# Patient Record
Sex: Male | Born: 1970 | Race: White | Hispanic: No | Marital: Single | Smoking: Current every day smoker
Health system: Southern US, Community
[De-identification: ages and names within clinical notes are randomized; demographics above are authoritative.]

## PROBLEM LIST (undated history)

## (undated) DIAGNOSIS — I739 Peripheral vascular disease, unspecified: Secondary | ICD-10-CM

## (undated) DIAGNOSIS — I82419 Acute embolism and thrombosis of unspecified femoral vein: Secondary | ICD-10-CM

## (undated) DIAGNOSIS — M1990 Unspecified osteoarthritis, unspecified site: Secondary | ICD-10-CM

## (undated) HISTORY — DX: Peripheral vascular disease, unspecified: I73.9

## (undated) HISTORY — PX: APPENDECTOMY: SHX54

---

## 1997-06-14 HISTORY — PX: WISDOM TOOTH EXTRACTION: SHX21

## 1998-06-14 HISTORY — PX: CYST EXCISION: SHX5701

## 2014-12-23 ENCOUNTER — Ambulatory Visit
Admission: EM | Admit: 2014-12-23 | Discharge: 2014-12-23 | Disposition: A | Discharge status: Home / Self Care | Payer: Managed Care, Other (non HMO) | Attending: Internal Medicine | Admitting: Internal Medicine

## 2014-12-23 ENCOUNTER — Encounter: Payer: Self-pay | Admitting: Emergency Medicine

## 2014-12-23 ENCOUNTER — Ambulatory Visit: Payer: Managed Care, Other (non HMO)

## 2014-12-23 DIAGNOSIS — M79671 Pain in right foot: Secondary | ICD-10-CM | POA: Insufficient documentation

## 2014-12-23 DIAGNOSIS — S92001A Unspecified fracture of right calcaneus, initial encounter for closed fracture: Secondary | ICD-10-CM | POA: Insufficient documentation

## 2014-12-23 DIAGNOSIS — W11XXXA Fall on and from ladder, initial encounter: Secondary | ICD-10-CM | POA: Insufficient documentation

## 2014-12-23 DIAGNOSIS — M25571 Pain in right ankle and joints of right foot: Secondary | ICD-10-CM | POA: Insufficient documentation

## 2014-12-23 MED ORDER — KETOROLAC TROMETHAMINE 60 MG/2ML IM SOLN
60.0000 mg | Freq: Once | INTRAMUSCULAR | Status: AC
  Administered 2014-12-23: 60 mg via INTRAMUSCULAR

## 2014-12-23 MED ORDER — HYDROCODONE-ACETAMINOPHEN 5-325 MG PO TABS: 1.0000 | ORAL_TABLET | Freq: Four times a day (QID) | ORAL | Status: DC | PRN

## 2014-12-23 NOTE — ED Notes (Signed)
Fell off ladder about 8 feet tall directly onto left heel

## 2014-12-23 NOTE — Discharge Instructions (Signed)
You are to be 100 % NON-WEIGHT -BEARING until seen by Orthopedics and evaluated.. Use crutches please ! ICE and ELEVATION will be very important !! Call tomorrow and schedule a consultation with Dr Joice Lofts or his PA on Wednesday at the  [facility]  Calcaneal Fracture Repair There are many different ways of treating fractures of the large irregular bone in the foot that makes up the heel of the foot (calcaneus). Calcaneal fractures can be treated with:   Immobilization--The fracture is casted as it is without changing the positions of the fracture involved.   Closed reduction--The bones are manipulated back into position without opening the site of the fracture using surgery.   Open reduction and internal fixation--The fracture site is opened and the bone pieces are fixed into place with some type of hardware (such as a screw).   Primary arthrodesis--The joint has enough damage that a procedure is done as the first treatment which will leave the joint permanently stiff. This will decrease function, however usually will leave the joint pain free. LET [facility] CARE PROVIDER KNOW ABOUT:  Any allergies you have.   All medicines you are taking, including vitamins, herbs, eye drops, creams, and over-the-counter medicines.   Previous problems you or members of your family have had with the use of anesthetics.  Any blood disorders you have.   Previous surgeries you have had.   Medical conditions you have.  RISKS AND COMPLICATIONS Generally, calcaneal fracture repair is a safe procedure. However, as with any procedure, complications can occur. Possible complications include:   Swelling of the foot and ankle.  Infection of the wound or bone.  Arthritis.  Chronic pain of the foot.  Nerve injury.  Blood clot in the legs or lungs. BEFORE THE PROCEDURE  Ask your health care provider about changing or stopping your regular medicines. You may need to stop taking  certain medicines, such as aspirin or blood thinners, at least 1 week before the surgery.  X-rays and any imaging studies are reviewed with your healthcare provider. The surgeon will advise you on the best surgical approach to repair your fracture.  Do not eat or drink anything for at least 8 hours before the surgery or as directed by your health care provider.   If you smoke, do not smoke for at least 2 weeks before the surgery.   Make plans to have someone drive you home after the procedure. Also arrange for someone to help you with activities during recovery.  PROCEDURE   You will be given medicine to help you relax (sedative). You will then be given medicine to make you sleep through the procedure (general anesthetic). These medicines will be given through an IV access tube that is put into one of your veins.   A nerve block or numbing medicine (local anesthetic) may also be used to keep you comfortable.  Once you are asleep, the foot will be cleaned and shaved if needed.  The surgeon may use a percutaneous or open technique for this surgery:  In the percutaneous approach, small cuts and pins are used to repair the fracture.  In the open technique, a cut is made along the outside of the foot and the bone pieces are placed back together with hardware. A drain may be left to collect fluid. It is removed 3-4 days after the procedure.  The surgeon then uses staples or stitches to close the incision or cuts. AFTER THE PROCEDURE  After surgery you will be  taken to the recovery area where a nurse will watch and check your progress for 1-3 hours. Once you are awake, stable, and taking fluids well, and if you do not have any other problems, you will be allowed to go home.  You will be given pain medicine if needed.  The IV access tube will be removed before you are discharged. Document Released: 03/10/2005 Document Revised: 03/21/2013 Document Reviewed: 01/02/2013 [facility] Patient  Information 2015 [place], [place]. This information is not intended to replace advice given to you by your health care provider. Make sure you discuss any questions you have with your health care provider.

## 2014-12-23 NOTE — ED Provider Notes (Signed)
CSN: [id number]     Arrival date & time 12/23/14  1728 History   First MD Initiated Contact with Patient 12/23/14 1854     Chief Complaint  Patient presents with  . Foot Injury   (Consider location/radiation/quality/duration/timing/severity/associated sxs/prior Treatment) HPI 44 yo M on approx 8 foot step ladder- load shifted and he lost his balance. Threw tools and fell the other way landing right heel first on a cement slab taking full impact through tennis shoes. Felt crushing pain. Relaxed legs and rolled into the grass. Denies striking any other part of his body-specifically denies any head contact or loss of awareness.   Smokes 1 PPD; drinks 12 pack beer on weekends, denies alcohol during week. Denies any pain anywhere in his body except right foot. Has been home before driving himself here.Takes Naproxen routinely for general aches-denies other medications  History reviewed. No pertinent past medical history. Past Surgical History  Procedure Laterality Date  . Cyst excision  2000  pilonidal cyst  History reviewed. No pertinent family history. History  Substance Use Topics  . Smoking status: Current Every Day Smoker  . Smokeless tobacco: Never Used  . Alcohol Use: Yes    Review of Systems Constitutional -afebrile Eyes-denies visual changes ENT- normal voice,denies sore throat CV-denies chest pain Resp-denies SOB GI- negative for nausea,vomiting, diarrhea GU- negative for dysuria MSK- negative for back pain, extremity pain except right foot and ankle Skin- denies acute changes Neuro- negative headache,focal weakness or numbness    Allergies  Review of patient's allergies indicates no known allergies.  Home Medications   Prior to Admission medications   Medication Sig Start Date End Date Taking? Authorizing Provider  naproxen sodium (ANAPROX) 220 MG tablet Take 220 mg by mouth 2 (two) times daily with a meal.   Yes Historical Provider, MD  HYDROcodone-acetaminophen  (NORCO/VICODIN) 5-325 MG per tablet Take 1-2 tablets by mouth every 6 (six) hours as needed. 12/23/14   Rae Halsted, PA-C   BP 120/75 mmHg  Pulse 90  Temp(Src) 98.2 F (36.8 C) (Tympanic)  Resp 16  Ht  (1.753 m)  Wt 168 lb (76.204 kg)  BMI 24.80 kg/m2  SpO2 97% Physical Exam   Constitutional -alert and oriented, discomfort specific to right foot and ankle-denies trauma to any other part of body Head-atraumatic, normocephalic Eyes- conjunctiva normal, EOMI ,conjugate gaze Ears- pierced, canals clear , TMs neg Nose- no congestion or rhinorrhea Mouth/throat- mucous membranes moist , Neck- supple without glandular enlargement CV- regular rate, grossly normal heart sounds,  Resp-no distress, normal respiratory effort,clear to auscultation bilaterally GI- soft,non-tender,no distention GU-  not examined MSK- right ankle is swollen, medial and lateral; dorsum of right foot especially laterally is swollen and distorted, lateral hind foot and heel are extremely tender to any touch-only mildly ecchymotic at this time. Sensation mildly altered on dorsum by patient report. Toes with good cap fill,warm. Too swollen to feel pulses. Can move toes . Tennis shoe removed for exam and xray very painful-pillow support. Neuro- normal speech and language, no gross focal neurological deficit appreciated,  Skin-warm,dry ,intact; no rash noted-ecchymosis developing right lateral ankle/heel; extensive tattooing  Psych-mood and affect grossly normal; speech and behavior grossly normal ED Course  Procedures (including critical care time) Labs Review Labs Reviewed - No data to display  Imaging Review Dg Ankle Complete Right  12/23/2014   CLINICAL DATA:  Ankle and heel pain after fall from ladder today. Initial encounter.  EXAM: RIGHT ANKLE - COMPLETE 3+ VIEW  COMPARISON:  None.  FINDINGS: There is an acute mildly depressed intra-articular compression fracture of the calcaneus. The talar dome, distal tibia  and distal fibula appear intact. There is mild hindfoot and lateral ankle soft tissue swelling.  IMPRESSION: No acute osseous findings demonstrated at the ankle. Mildly depressed intra-articular compression fracture of the calcaneus.   Electronically Signed   By: Carey BullocksWilliam  Veazey M.D.   On: 12/23/2014 18:40   Dg Os Calcis Right  12/23/2014   CLINICAL DATA:  Ankle and heel pain after fall from ladder today. Initial encounter.  EXAM: RIGHT OS CALCIS - 2+ VIEW  COMPARISON:  None.  FINDINGS: There is an acute mildly depressed and displaced intra-articular compression fracture of the calcaneus. This has a centrolateral depression type configuration with extension to the subtalar joint. The talus appears intact.  IMPRESSION: Mildly depressed and displaced intra-articular compression fracture of the calcaneus.   Electronically Signed   By: Carey BullocksWilliam  Veazey M.D.   On: 12/23/2014 18:44   Dg Foot Complete Right  12/23/2014   CLINICAL DATA:  Ankle and heel pain after fall from ladder today. Initial encounter.  EXAM: RIGHT FOOT COMPLETE - 3+ VIEW  COMPARISON:  None.  FINDINGS: There is an acute mildly depressed intra-articular compression fracture of the calcaneus. The talus appears intact. The additional tarsal bones appear intact. No forefoot abnormalities identified. Hindfoot soft tissue swelling noted.  IMPRESSION: Acute mildly depressed intra-articular compression fracture of the calcaneus.   Electronically Signed   By: Carey BullocksWilliam  Veazey M.D.   On: 12/23/2014 18:41   Medications  ketorolac (TORADOL) injection 60 mg (60 mg Intramuscular Given 12/23/14 1923)  Felt significant improvement in pain with Rx  Consulted Dr. Joice LoftsPoggi Gaylord Shih[facility] and he recommended boot orthosis, crutches, non-weight bearing-ice/elevation. [facility] schedule a visit with Dr Joice LoftsPoggi or his PA on Wednesday. This information was shared with the patient. He states he understands and will follow  through  Fitted with Large boot orthosis and crutches. MDM   1. Calcaneus fracture, right, closed, initial encounter    Plan: 1. Test/x-ray films and results with  diagnosis reviewed with patient- Ortho contact was established and consultation as noted above. He will follow up with appointment scheduling tomorrow for Wednesday. 2. He initially deferred pain Rx but was encouraged to have a small amount of Vicodin available  and accepted. Rx  as per orders; risks, benefits, potential side effects reviewed with patient. May use ibuprofen 800 mg every 6-8 hours as needed to support for pain relief. 3. Recommend supportive treatment with boot orthosis, crutches, non-weight bearing, ice, elevation- continual - stressed. 4. F/u if toe checks fail to show good cap fill, sensation decreases in foot or toes, or pain increases /not controlled by Rx supplied. [facility] if increased assistance required before Wed appointment. 5. Questions fielded, expectations and recommendations reviewed. Patient expresses understanding.  Rae HalstedLaurie W Aara Jacquot, PA-C 12/25/14 1712

## 2014-12-24 ENCOUNTER — Other Ambulatory Visit: Payer: Self-pay | Admitting: Podiatry

## 2014-12-24 DIAGNOSIS — S92001A Unspecified fracture of right calcaneus, initial encounter for closed fracture: Secondary | ICD-10-CM

## 2014-12-27 ENCOUNTER — Encounter: Payer: Self-pay | Admitting: *Deleted

## 2014-12-30 ENCOUNTER — Ambulatory Visit
Admission: RE | Admit: 2014-12-30 | Discharge: 2014-12-30 | Disposition: A | Discharge status: Home / Self Care | Payer: Managed Care, Other (non HMO) | Source: Ambulatory Visit | Attending: Podiatry | Admitting: Podiatry

## 2014-12-30 DIAGNOSIS — X58XXXA Exposure to other specified factors, initial encounter: Secondary | ICD-10-CM | POA: Diagnosis not present

## 2014-12-30 DIAGNOSIS — S92001A Unspecified fracture of right calcaneus, initial encounter for closed fracture: Secondary | ICD-10-CM

## 2014-12-30 NOTE — Discharge Instructions (Signed)
[facility] °[facility] ° °POST OPERATIVE INSTRUCTIONS FOR DR. TROXLER AND DR. FOWLER °[facility] ° ° °1. Take your medication as prescribed.  Pain medication should be taken only as needed. ° °2. Keep the dressing clean, dry and intact. ° °3. Keep your foot elevated above the heart level for the first 48 hours. ° °4. Walking to the bathroom and brief periods of walking are acceptable, unless we have instructed you to be non-weight bearing. ° °5. Always wear your post-op shoe when walking.  Always use your crutches if you are to be non-weight bearing. ° °6. Do not take a shower. Baths are permissible as long as the foot is kept out of the water.  ° °7. Every hour you are awake:  °- Bend your knee 15 times. °- Flex foot 15 times °- Massage calf 15 times ° °8. Call [facility] ([phone]) if any of the following problems occur: °- You develop a temperature or fever. °- The bandage becomes saturated with blood. °- Medication does not stop your pain. °- Injury of the foot occurs. °- Any symptoms of infection including redness, odor, or red streaks running from wound. °-  ° °General Anesthesia, Care After °Refer to this sheet in the next few weeks. These instructions provide you with information on caring for yourself after your procedure. Your health care provider may also give you more specific instructions. Your treatment has been planned according to current medical practices, but problems sometimes occur. Call your health care provider if you have any problems or questions after your procedure. °WHAT TO EXPECT AFTER THE PROCEDURE °After the procedure, it is typical to experience: °· Sleepiness. °· Nausea and vomiting. °HOME CARE INSTRUCTIONS °· For the first 24 hours after general anesthesia: °¨ Have a responsible person with you. °¨ Do not drive a car. If you are alone, do not take public transportation. °¨ Do not drink alcohol. °¨ Do not take medicine  that has not been prescribed by your health care provider. °¨ Do not sign important papers or make important decisions. °¨ You may resume a normal diet and activities as directed by your health care provider. °· Change bandages (dressings) as directed. °· If you have questions or problems that seem related to general anesthesia, call the hospital and ask for the anesthetist or anesthesiologist on call. °SEEK MEDICAL CARE IF: °· You have nausea and vomiting that continue the day after anesthesia. °· You develop a rash. °SEEK IMMEDIATE MEDICAL CARE IF:  °· You have difficulty breathing. °· You have chest pain. °· You have any allergic problems. °Document Released: 09/06/2000 Document Revised: 06/05/2013 Document Reviewed: 12/14/2012 °ExitCare® Patient Information ©2015 Exit[employer], LLC. This information is not intended to replace advice given to you by your health care provider. Make sure you discuss any questions you have with your health care provider. ° °

## 2015-01-01 ENCOUNTER — Ambulatory Visit
Admission: RE | Admit: 2015-01-01 | Discharge: 2015-01-01 | Disposition: A | Discharge status: Home / Self Care | Payer: Managed Care, Other (non HMO) | Source: Ambulatory Visit | Attending: Podiatry | Admitting: Podiatry

## 2015-01-01 ENCOUNTER — Encounter
Admission: RE | Disposition: A | Discharge status: Home / Self Care | Payer: Self-pay | Source: Ambulatory Visit | Attending: Podiatry

## 2015-01-01 ENCOUNTER — Ambulatory Visit: Payer: Managed Care, Other (non HMO) | Admitting: Anesthesiology

## 2015-01-01 DIAGNOSIS — F17200 Nicotine dependence, unspecified, uncomplicated: Secondary | ICD-10-CM | POA: Insufficient documentation

## 2015-01-01 DIAGNOSIS — H409 Unspecified glaucoma: Secondary | ICD-10-CM | POA: Diagnosis not present

## 2015-01-01 DIAGNOSIS — Z79899 Other long term (current) drug therapy: Secondary | ICD-10-CM | POA: Insufficient documentation

## 2015-01-01 DIAGNOSIS — W11XXXA Fall on and from ladder, initial encounter: Secondary | ICD-10-CM | POA: Insufficient documentation

## 2015-01-01 DIAGNOSIS — S92061A Displaced intraarticular fracture of right calcaneus, initial encounter for closed fracture: Secondary | ICD-10-CM | POA: Insufficient documentation

## 2015-01-01 DIAGNOSIS — S92001A Unspecified fracture of right calcaneus, initial encounter for closed fracture: Secondary | ICD-10-CM | POA: Diagnosis present

## 2015-01-01 HISTORY — PX: ORIF ANKLE FRACTURE: SHX5408

## 2015-01-01 SURGERY — OPEN REDUCTION INTERNAL FIXATION (ORIF) ANKLE FRACTURE
Anesthesia: Regional | Laterality: Right | Wound class: Clean

## 2015-01-01 MED ORDER — GLYCOPYRROLATE 0.2 MG/ML IJ SOLN
INTRAMUSCULAR | Status: DC | PRN
  Administered 2015-01-01: 0.1 mg via INTRAVENOUS

## 2015-01-01 MED ORDER — OXYCODONE-ACETAMINOPHEN 7.5-325 MG PO TABS: 1.0000 | ORAL_TABLET | ORAL | Status: DC | PRN

## 2015-01-01 MED ORDER — LACTATED RINGERS IV SOLN
INTRAVENOUS | Status: DC
  Administered 2015-01-01 (×2): via INTRAVENOUS

## 2015-01-01 MED ORDER — LACTATED RINGERS IV SOLN: 500.0000 mL | INTRAVENOUS | Status: DC

## 2015-01-01 MED ORDER — LIDOCAINE HCL (CARDIAC) 20 MG/ML IV SOLN
INTRAVENOUS | Status: DC | PRN
  Administered 2015-01-01: 50 mg via INTRATRACHEAL

## 2015-01-01 MED ORDER — MIDAZOLAM HCL 2 MG/2ML IJ SOLN
INTRAMUSCULAR | Status: DC | PRN
  Administered 2015-01-01: 4 mg via INTRAVENOUS
  Administered 2015-01-01: 2 mg via INTRAVENOUS

## 2015-01-01 MED ORDER — OXYCODONE HCL 5 MG PO TABS: 5.0000 mg | ORAL_TABLET | Freq: Once | ORAL | Status: DC | PRN

## 2015-01-01 MED ORDER — ACETAMINOPHEN 325 MG PO TABS: 325.0000 mg | ORAL_TABLET | ORAL | Status: DC | PRN

## 2015-01-01 MED ORDER — ONDANSETRON HCL 4 MG/2ML IJ SOLN
4.0000 mg | Freq: Once | INTRAMUSCULAR | Status: AC | PRN
  Administered 2015-01-01: 4 mg via INTRAVENOUS

## 2015-01-01 MED ORDER — ACETAMINOPHEN 160 MG/5ML PO SOLN: 325.0000 mg | ORAL | Status: DC | PRN

## 2015-01-01 MED ORDER — ROPIVACAINE HCL 5 MG/ML IJ SOLN
INTRAMUSCULAR | Status: DC | PRN
  Administered 2015-01-01: 40 mL via PERINEURAL

## 2015-01-01 MED ORDER — FENTANYL CITRATE (PF) 100 MCG/2ML IJ SOLN
INTRAMUSCULAR | Status: DC | PRN
  Administered 2015-01-01: 100 ug via INTRAVENOUS
  Administered 2015-01-01 (×2): 25 ug via INTRAVENOUS

## 2015-01-01 MED ORDER — 0.9 % SODIUM CHLORIDE (POUR BTL) OPTIME
TOPICAL | Status: DC | PRN
  Administered 2015-01-01: 700 mL

## 2015-01-01 MED ORDER — BUPIVACAINE HCL (PF) 0.25 % IJ SOLN
INTRAMUSCULAR | Status: DC | PRN
  Administered 2015-01-01: 10 mL

## 2015-01-01 MED ORDER — OXYCODONE HCL 5 MG/5ML PO SOLN: 5.0000 mg | Freq: Once | ORAL | Status: DC | PRN

## 2015-01-01 MED ORDER — DEXAMETHASONE SODIUM PHOSPHATE 4 MG/ML IJ SOLN
INTRAMUSCULAR | Status: DC | PRN
  Administered 2015-01-01: 4 mg via INTRAVENOUS
  Administered 2015-01-01: 4 mg via PERINEURAL

## 2015-01-01 MED ORDER — PROPOFOL 10 MG/ML IV BOLUS
INTRAVENOUS | Status: DC | PRN
  Administered 2015-01-01: 200 mg via INTRAVENOUS

## 2015-01-01 MED ORDER — CEFAZOLIN SODIUM-DEXTROSE 2-3 GM-% IV SOLR
2.0000 g | Freq: Once | INTRAVENOUS | Status: AC
  Administered 2015-01-01: 2 g via INTRAVENOUS

## 2015-01-01 SURGICAL SUPPLY — 58 items
2.0 MM K WIRE ×2 IMPLANT
2.5 MM DRILL BIT ( BIOMET) ×2 IMPLANT
2.7MM DRILL BIT ×2 IMPLANT
5.0MM SCHANZ PIN ×2 IMPLANT
BANDAGE ELASTIC 4 CLIP NS LF (GAUZE/BANDAGES/DRESSINGS) ×4 IMPLANT
BLADE SURG 15 STRL LF DISP TIS (BLADE) ×1 IMPLANT
BLADE SURG 15 STRL SS (BLADE) ×1
BLADE SURG MINI STRL (BLADE) IMPLANT
BNDG ESMARK 4X12 TAN STRL LF (GAUZE/BANDAGES/DRESSINGS) ×2 IMPLANT
BNDG GAUZE 4.5X4.1 6PLY STRL (MISCELLANEOUS) ×2 IMPLANT
BNDG STRETCH 4X75 STRL LF (GAUZE/BANDAGES/DRESSINGS) ×2 IMPLANT
CANISTER SUCT 1200ML W/VALVE (MISCELLANEOUS) ×2 IMPLANT
COVER PIN YLW 0.028-062 (MISCELLANEOUS) ×10 IMPLANT
DRAPE C-ARM XRAY 36X54 (DRAPES) ×2 IMPLANT
DRAPE FLUOR MINI C-ARM 54X84 (DRAPES) IMPLANT
DURAPREP 26ML APPLICATOR (WOUND CARE) ×2 IMPLANT
GAUZE PETRO XEROFOAM 1X8 (MISCELLANEOUS) ×2 IMPLANT
GAUZE SPONGE 4X4 12PLY STRL (GAUZE/BANDAGES/DRESSINGS) ×2 IMPLANT
GLOVE BIO SURGEON STRL SZ7.5 (GLOVE) ×2 IMPLANT
GLOVE INDICATOR 8.0 STRL GRN (GLOVE) ×2 IMPLANT
GOWN STRL REUS W/ TWL LRG LVL3 (GOWN DISPOSABLE) ×2 IMPLANT
GOWN STRL REUS W/TWL LRG LVL3 (GOWN DISPOSABLE) ×2
K-WIRE DBL END TROCAR 6X.045 (WIRE)
K-WIRE DBL END TROCAR 6X.062 (WIRE) ×12
KWIRE DBL END TROCAR 6X.045 (WIRE) IMPLANT
KWIRE DBL END TROCAR 6X.062 (WIRE) ×6 IMPLANT
NEEDLE FILTER BLUNT 18X 1/2SAF (NEEDLE) ×1
NEEDLE FILTER BLUNT 18X1 1/2 (NEEDLE) ×1 IMPLANT
NEEDLE HYPO 27GX1-1/4 (NEEDLE) ×2 IMPLANT
NS IRRIG 500ML POUR BTL (IV SOLUTION) IMPLANT
PACK EXTREMITY ARMC (MISCELLANEOUS) ×2 IMPLANT
PAD ABD DERMACEA PRESS 5X9 (GAUZE/BANDAGES/DRESSINGS) ×2 IMPLANT
PAD GROUND ADULT SPLIT (MISCELLANEOUS) ×2 IMPLANT
PADDING CAST 2X4YD ST (MISCELLANEOUS) ×1
PADDING CAST BLEND 2X4 STRL (MISCELLANEOUS) ×1 IMPLANT
PLATE CALC MIS EXTEND SM RT (Plate) ×2 IMPLANT
SCREW CORT 3.5X40 (Screw) ×4 IMPLANT
SCREW LOCK CORT STAR 3.5X34 (Screw) ×2 IMPLANT
SCREW LOCK CORT STAR 3.5X36 (Screw) ×2 IMPLANT
SCREW LOCK CORT STAR 3.5X40 (Screw) ×2 IMPLANT
SCREW LP 3.5X38MM (Screw) ×2 IMPLANT
SCREW NL LP 36X3.5 (Screw) ×2 IMPLANT
SPLINT CAST 1 STEP 4X30 (MISCELLANEOUS) ×2 IMPLANT
STOCKINETTE IMPERVIOUS LG (DRAPES) ×2 IMPLANT
STRAP BODY AND KNEE 60X3 (MISCELLANEOUS) ×2 IMPLANT
STRIP CLOSURE SKIN 1/4X4 (GAUZE/BANDAGES/DRESSINGS) ×2 IMPLANT
SUT ETHILON 3-0 KS 30 BLK (SUTURE) ×2 IMPLANT
SUT ETHILON 4-0 (SUTURE)
SUT ETHILON 4-0 FS2 18XMFL BLK (SUTURE)
SUT ETHILON 5-0 FS-2 18 BLK (SUTURE) IMPLANT
SUT MNCRL+ 5-0 UNDYED PC-3 (SUTURE) IMPLANT
SUT MONOCRYL 5-0 (SUTURE)
SUT VIC AB 2-0 SH 27 (SUTURE) ×1
SUT VIC AB 2-0 SH 27XBRD (SUTURE) ×1 IMPLANT
SUT VIC AB 4-0 FS2 27 (SUTURE) ×2 IMPLANT
SUT VICRYL AB 3-0 FS1 BRD 27IN (SUTURE) ×2 IMPLANT
SUTURE ETHLN 4-0 FS2 18XMF BLK (SUTURE) IMPLANT
SYRINGE 10CC LL (SYRINGE) ×2 IMPLANT

## 2015-01-01 NOTE — Transfer of Care (Signed)
Immediate Anesthesia Transfer of Care Note  Patient: Sean Chen  Procedure(s) Performed: Procedure(s) with comments: OPEN REDUCTION INTERNAL FIXATION (ORIF) CALCANEUS (Right) - POPLITEAL  Patient Location: PACU  Anesthesia Type: General, Regional  Level of Consciousness: awake, alert  and patient cooperative  Airway and Oxygen Therapy: Patient Spontanous Breathing and Patient connected to supplemental oxygen  Post-op Assessment: Post-op Vital signs reviewed, Patient's Cardiovascular Status Stable, Respiratory Function Stable, Patent Airway and No signs of Nausea or vomiting  Post-op Vital Signs: Reviewed and stable  Complications: No apparent anesthesia complications

## 2015-01-01 NOTE — Op Note (Signed)
Operative note   Surgeon:Telissa Palmisano Armed forces logistics/support/administrative officerowler    Assistant: None    Preop diagnosis: Right calcaneal fracture    Postop diagnosis: Same    Procedure: Open reduction with internal fixation right calcaneal fracture    EBL: Minimal    Anesthesia:regional and general    Hemostasis: Thigh tourniquet inflated to 325 mmHg for 110 minutes    Specimen: None    Complications: None    Operative indications: 44 year old gentleman who sustained a intra-articular calcaneal fracture with pronation. Discussed surgical and nonsurgical intervention. Since today for surgery    Procedure:  Patient was brought into the OR and placed on the operating table in thesupine position. After anesthesia was obtained theright lower extremity was prepped and draped in usual sterile fashion.  After inflation of the tourniquet the lateral aspect of the ankle at the subtalar joint was a curved incision beginning at approximately the calcaneocuboid joint and ending at the the fibula. Sharp and blunt dissection was carried down to the subtalar joint region. As time subperiosteal dissection was then undertaken to remove the lateral aspect of the soft tissue from the lateral calcaneal wall. The peroneal tendons were noted throughout the procedure and retracted throughout the procedure. At this time there was noted be a impacted rotated posterior facet fracture.  A large Steinmann pin was placed in the calcaneus to disimpact the subtalar joint. After motion of the subtalar joint I was able to freely move the lateral posterior facet fracture and place it in an anatomically aligned position with the sustentacular fracture. I was able to visualize the alignment and temporarily stabilize this with K wires. The remainder fracture fragments were then stabilized with K wires as well. We'll use of fluoroscopy noted good realignment of the subtalar and calcaneocuboid joint fracture areas. This time the small lateral extended minimal incision  calcaneal plate from [employer] was then placed into the fracture site. Good alignment was noted on all views. Using standard technique the initial sustentacular fracture was stabilized with a Schanz screw. Remaining screw holes were filled using the standard technique with a combination of locking and nonlocking screws. Good compression of the plate against the back of the calcaneus was noted prior to placement of locking screws. Multiple views of fluoroscopy revealed good realignment of the subtalar joint. At this time the wound was flushed with copious amounts or irrigation. Layered closure was performed with a 2-0 and 3-0 Vicryl and a nylon for skin. 0.25% Marcaine was then placed to the incision site. A sterile dressing was placed on the lateral aspect of the foot. He was then placed in a posterior splint. Patient tolerated the procedure and anesthesia well.    Patient tolerated the procedure and anesthesia well.  Was transported from the OR to the PACU with all vital signs stable and vascular status intact. To be discharged per routine protocol.  Will follow up in approximately 1 week in the outpatient clinic.

## 2015-01-01 NOTE — Progress Notes (Signed)
Assisted Johny Blameraniel Kovacs ANMD with popliteal/saphenous block. Side rails up, monitors on throughout procedure. See vital signs in flow sheet. Tolerated Procedure well.

## 2015-01-01 NOTE — H&P (Signed)
HISTORY AND PHYSICAL INTERVAL NOTE:  01/01/2015  11:40 AM  Sean Chen  has presented today for surgery, with the diagnosis of R CALCANEALFX [id number].  The various methods of treatment have been discussed with the patient.  No guarantees were given.  After consideration of risks, benefits and other options for treatment, the patient has consented to surgery.  I have reviewed the patients' chart and labs.    Patient Vitals for the past 24 hrs:  Height Weight  01/01/15 1104 5\' 9"  (1.753 m) 75.751 kg (167 lb)    A history and physical examination was performed in my office.  The patient was reexamined.  There have been no changes to this history and physical examination.  Gwyneth RevelsFowler, Dalin Caldera A

## 2015-01-01 NOTE — Anesthesia Procedure Notes (Addendum)
Anesthesia Regional Block:  Popliteal block  Pre-Anesthetic Checklist: ,, timeout performed, Correct Patient, Correct Site, Correct Laterality, Correct Procedure, Correct Position, site marked, Risks and benefits discussed,  Surgical consent,  Pre-op evaluation,  At surgeon's request and post-op pain management  Laterality: Right  Prep: chloraprep       Needles:  Injection technique: Single-shot      Additional Needles:  Procedures: ultrasound guided (picture in chart) Popliteal block Narrative:  Start time: 01/01/2015 11:29 AM End time: 01/01/2015 11:44 AM Injection made incrementally with aspirations every 30 mL.  Performed by: Personally  Anesthesiologist: Johny BlamerKOVACS, DANIEL D   Anesthesia Regional Block:  Femoral nerve block  Pre-Anesthetic Checklist: ,, timeout performed, Correct Patient, Correct Site, Correct Laterality, Correct Procedure, Correct Position, site marked, Risks and benefits discussed,  Surgical consent,  Pre-op evaluation,  At surgeon's request and post-op pain management  Laterality: Right  Prep: chloraprep       Needles:  Injection technique: Single-shot  Needle Type: Stimiplex     Needle Length: 4cm 4 cm Needle Gauge: 21 and 21 G    Additional Needles:  Procedures: ultrasound guided (picture in chart) Femoral nerve block Narrative:  Start time: 01/01/2015 11:29 AM End time: 01/01/2015 11:43 AM Injection made incrementally with aspirations every 10 mL.  Performed by: Personally  Anesthesiologist: Johny BlamerKOVACS, DANIEL D   Procedure Name: LMA Insertion Date/Time: 01/01/2015 12:12 PM Performed by: Jimmy PicketAMYOT, Theadore Blunck Pre-anesthesia Checklist: Patient identified, Emergency Drugs available, Suction available, Timeout performed and Patient being monitored Patient Re-evaluated:Patient Re-evaluated prior to inductionOxygen Delivery Method: Circle system utilized Preoxygenation: Pre-oxygenation with 100% oxygen Intubation Type: IV induction LMA: LMA  inserted LMA Size: 4.0 Number of attempts: 1 Placement Confirmation: positive ETCO2 and breath sounds checked- equal and bilateral Tube secured with: Tape

## 2015-01-01 NOTE — Anesthesia Postprocedure Evaluation (Signed)
  Anesthesia Post-op Note  Patient: Sean LeechVictor Chen  Procedure(s) Performed: Procedure(s) with comments: OPEN REDUCTION INTERNAL FIXATION (ORIF) CALCANEUS (Right) - POPLITEAL  Anesthesia type:General, Regional  Patient location: PACU  Post pain: Pain level controlled  Post assessment: Post-op Vital signs reviewed, Patient's Cardiovascular Status Stable, Respiratory Function Stable, Patent Airway and No signs of Nausea or vomiting  Post vital signs: Reviewed and stable  Last Vitals:  Filed Vitals:   01/01/15 1201  BP: 117/87  Pulse:   Resp: 13    Level of consciousness: awake, alert  and patient cooperative  Complications: No apparent anesthesia complications

## 2015-01-01 NOTE — Anesthesia Preprocedure Evaluation (Signed)
Anesthesia Evaluation  Patient identified by MRN, date of birth, ID band Patient awake    Reviewed: Allergy & Precautions, H&P , NPO status , Patient's Chart, lab work & pertinent test results, reviewed documented beta blocker date and time   Airway Mallampati: II  TM Distance: >3 FB Neck ROM: full    Dental no notable dental hx.    Pulmonary Current Smoker,  breath sounds clear to auscultation  Pulmonary exam normal       Cardiovascular Exercise Tolerance: Good negative cardio ROS  Rhythm:regular Rate:Normal     Neuro/Psych negative neurological ROS  negative psych ROS   GI/Hepatic negative GI ROS, Neg liver ROS,   Endo/Other  negative endocrine ROS  Renal/GU negative Renal ROS  negative genitourinary   Musculoskeletal   Abdominal   Peds  Hematology negative hematology ROS (+)   Anesthesia Other Findings   Reproductive/Obstetrics negative OB ROS                             Anesthesia Physical Anesthesia Plan  ASA: II  Anesthesia Plan: General and Regional   Post-op Pain Management:    Induction:   Airway Management Planned:   Additional Equipment:   Intra-op Plan:   Post-operative Plan:   Informed Consent: I have reviewed the patients History and Physical, chart, labs and discussed the procedure including the risks, benefits and alternatives for the proposed anesthesia with the patient or authorized representative who has indicated his/her understanding and acceptance.     Plan Discussed with: CRNA  Anesthesia Plan Comments:         Anesthesia Quick Evaluation  

## 2015-01-03 ENCOUNTER — Encounter: Payer: Self-pay | Admitting: Podiatry

## 2015-12-20 ENCOUNTER — Encounter: Payer: Self-pay | Admitting: *Deleted

## 2015-12-20 ENCOUNTER — Ambulatory Visit
Admission: EM | Admit: 2015-12-20 | Discharge: 2015-12-20 | Disposition: A | Discharge status: Home / Self Care | Payer: Managed Care, Other (non HMO) | Attending: Family Medicine | Admitting: Family Medicine

## 2015-12-20 DIAGNOSIS — S0181XA Laceration without foreign body of other part of head, initial encounter: Secondary | ICD-10-CM | POA: Diagnosis not present

## 2015-12-20 MED ORDER — MUPIROCIN CALCIUM 2 % EX CREA: 1.0000 "application " | TOPICAL_CREAM | Freq: Two times a day (BID) | CUTANEOUS | Status: DC

## 2015-12-20 NOTE — ED Provider Notes (Signed)
CSN: [id number]     Arrival date & time 12/20/15  0941 History   First MD Initiated Contact with Patient 12/20/15 1008     Chief Complaint  Patient presents with  . Facial Laceration   (Consider location/radiation/quality/duration/timing/severity/associated sxs/prior Treatment) HPI: Patient presents today with symptoms of 2 small lacerations to the left eyebrow region. Patient states that he was breaking a piece of would when part of the would along onto his eyebrow region. He denies any other injury anywhere else. He denies any loss of consciousness or any vision problems. Denies any headache. He admits to having a tetanus within the year.  History reviewed. No pertinent past medical history. Past Surgical History  Procedure Laterality Date  . Cyst excision  2000  . Appendectomy    . Orif ankle fracture Right 01/01/2015    Procedure: OPEN REDUCTION INTERNAL FIXATION (ORIF) CALCANEUS;  Surgeon: Gwyneth RevelsJustin Fowler, DPM;  [facility];  Service: Podiatry;  Laterality: Right;  POPLITEAL   History reviewed. No pertinent family history. Social History  Substance Use Topics  . Smoking status: Current Every Day Smoker -- 1.00 packs/day    Types: Cigarettes  . Smokeless tobacco: Former NeurosurgeonUser  . Alcohol Use: 7.2 oz/week    0 Glasses of wine, 12 Cans of beer per week    Review of Systems: Negative except mentioned above.   Allergies  Review of patient's allergies indicates no known allergies.  Home Medications   Prior to Admission medications   Medication Sig Start Date End Date Taking? Authorizing Provider  celecoxib (CELEBREX) 100 MG capsule Take 100 mg by mouth 2 (two) times daily.   Yes Historical Provider, MD  etodolac (LODINE) 500 MG tablet Take 500 mg by mouth 2 (two) times daily.   Yes Historical Provider, MD  naproxen sodium (ANAPROX) 220 MG tablet Take 220 mg by mouth 2 (two) times daily with a meal.   Yes Historical Provider, MD  traMADol (ULTRAM) 50 MG tablet Take  by mouth every 6 (six) hours as needed.   Yes Historical Provider, MD  oxyCODONE-acetaminophen (PERCOCET) 7.5-325 MG per tablet Take 1 tablet by mouth every 4 (four) hours as needed for severe pain. 01/01/15   Gwyneth RevelsJustin Fowler, DPM   Meds Ordered and Administered this Visit  Medications - No data to display  BP 139/86 mmHg  Pulse 91  Temp(Src) 98.1 F (36.7 C) (Oral)  Resp 16  Ht 5\' 9"  (1.753 m)  Wt 170 lb (77.111 kg)  BMI 25.09 kg/m2  SpO2 98% No data found.   Physical Exam:  GENERAL: NAD HEENT: PERRL, EOMI RESP: CTA B CARD: RRR SKIN: approx. .75cm and .5cm laceration to the left lateral eyebrow area NEURO: CN II-XII grossly intact   ED Course  Procedures (including critical care time)  Labs Review Labs Reviewed - No data to display  Imaging Review No results found.    MDM  A/P: Facial Laceration- verbal consent obtained, sterile technique used, lidocaine 1% without epi used to anesthetize the area, 5-0 nylon was used to suture site, 4 sutures were placed, keep area clean and dry and monitor for any signs of infection, patient to review handout on head trauma, I have advised that he take it easy today and doors, if any worsening symptoms to go to the ER, antibiotic ointment to the area for a few days to help prevent infection, suture removal in 5-7 days, return sooner if any problems with sutures.     Jolene ProvostKirtida Garmon Dehn, MD 12/20/15  1531 

## 2015-12-20 NOTE — ED Notes (Signed)
Pt was breaking a piece of wood into this am to burn it and the wood struck him over the left eye. Pt has 2 lacerations to the lateral aspect of the left orbit. Top is approx 1", bottom is approx 1/2". Pt denies LOC, neck/back pain or other injuries. Bleeding is controlled.

## 2015-12-26 ENCOUNTER — Ambulatory Visit
Admission: EM | Admit: 2015-12-26 | Discharge: 2015-12-26 | Disposition: A | Discharge status: Home / Self Care | Payer: Managed Care, Other (non HMO) | Attending: Emergency Medicine | Admitting: Emergency Medicine

## 2015-12-26 ENCOUNTER — Encounter: Payer: Self-pay | Admitting: *Deleted

## 2015-12-26 DIAGNOSIS — Z4802 Encounter for removal of sutures: Secondary | ICD-10-CM

## 2015-12-26 NOTE — ED Notes (Signed)
Suture removal. Laceration was well healed with no redness or drainage present. 4 sutures removed.

## 2015-12-27 ENCOUNTER — Ambulatory Visit
Admission: EM | Admit: 2015-12-27 | Discharge: 2015-12-27 | Disposition: A | Discharge status: Home / Self Care | Payer: Managed Care, Other (non HMO) | Attending: Internal Medicine | Admitting: Internal Medicine

## 2015-12-27 ENCOUNTER — Ambulatory Visit (INDEPENDENT_AMBULATORY_CARE_PROVIDER_SITE_OTHER): Payer: Managed Care, Other (non HMO)

## 2015-12-27 DIAGNOSIS — S56911A Strain of unspecified muscles, fascia and tendons at forearm level, right arm, initial encounter: Secondary | ICD-10-CM

## 2015-12-27 DIAGNOSIS — S52501A Unspecified fracture of the lower end of right radius, initial encounter for closed fracture: Secondary | ICD-10-CM

## 2015-12-27 DIAGNOSIS — S46911A Strain of unspecified muscle, fascia and tendon at shoulder and upper arm level, right arm, initial encounter: Secondary | ICD-10-CM

## 2015-12-27 MED ORDER — HYDROCODONE-ACETAMINOPHEN 5-325 MG PO TABS: 1.0000 | ORAL_TABLET | Freq: Four times a day (QID) | ORAL | Status: DC | PRN

## 2015-12-27 NOTE — Discharge Instructions (Signed)
-  Keep splint dry and intact -[facility] for follow-up appointment. -Keep left wrist elevated above heart level. -Ibuprofen and Norco as needed for discomfort. -Return to Urgent Care if symptoms worsen.

## 2015-12-27 NOTE — ED Provider Notes (Signed)
CSN: [id number]     Arrival date & time 12/27/15  1420 History   First MD Initiated Contact with Patient 12/27/15 1432     Chief Complaint  Patient presents with  . Wrist Pain   HPI  Patient presents today for evaluation of left wrist pain as well as left elbow pain after a fall. Before presenting to the urgent care, the patient was on a ladder approximately 5 feet above the ground when he fell landing on his left wrist with the rest extended. The patient. And noticed immediate pain and swelling to left wrist. The patient is left-hand dominant. Patient notes numbness and tingling to left-hand however this is chronic in nature. Patients also reporting pain and left elbow with range of motion activities however this pain is mild. He denies any previous surgical history to the left wrist.   History reviewed. No pertinent past medical history. Past Surgical History  Procedure Laterality Date  . Cyst excision  2000  . Appendectomy    . Orif ankle fracture Right 01/01/2015    Procedure: OPEN REDUCTION INTERNAL FIXATION (ORIF) CALCANEUS;  Surgeon: Gwyneth RevelsJustin Fowler, DPM;  [facility];  Service: Podiatry;  Laterality: Right;  POPLITEAL   History reviewed. No pertinent family history. Social History  Substance Use Topics  . Smoking status: Current Every Day Smoker -- 1.00 packs/day    Types: Cigarettes  . Smokeless tobacco: Former NeurosurgeonUser  . Alcohol Use: 7.2 oz/week    0 Glasses of wine, 12 Cans of beer per week    Review of Systems  Constitutional: Negative.   HENT: Negative.   Eyes: Negative.   Respiratory: Negative.   Cardiovascular: Negative.   Gastrointestinal: Negative.   Endocrine: Negative.   Genitourinary: Negative.   Musculoskeletal: Positive for myalgias, joint swelling and arthralgias.  Skin: Negative.   Allergic/Immunologic: Negative.   Neurological: Negative.   Hematological: Negative.   Psychiatric/Behavioral: Negative.     Allergies  Review of patient's  allergies indicates no known allergies.  Home Medications   Prior to Admission medications   Medication Sig Start Date End Date Taking? Authorizing Provider  celecoxib (CELEBREX) 100 MG capsule Take 100 mg by mouth 2 (two) times daily.   Yes Historical Provider, MD  etodolac (LODINE) 500 MG tablet Take 500 mg by mouth 2 (two) times daily.   Yes Historical Provider, MD  mupirocin cream (BACTROBAN) 2 % Apply 1 application topically 2 (two) times daily. For 5-7 days 12/20/15  Yes Jolene ProvostKirtida Patel, MD  naproxen sodium (ANAPROX) 220 MG tablet Take 220 mg by mouth 2 (two) times daily with a meal.   Yes Historical Provider, MD  traMADol (ULTRAM) 50 MG tablet Take by mouth every 6 (six) hours as needed.   Yes Historical Provider, MD  HYDROcodone-acetaminophen (NORCO) 5-325 MG tablet Take 1 tablet by mouth every 6 (six) hours as needed for moderate pain. 12/27/15   Anson OregonJames Lance Beryl Hornberger, PA-C  oxyCODONE-acetaminophen (PERCOCET) 7.5-325 MG per tablet Take 1 tablet by mouth every 4 (four) hours as needed for severe pain. 01/01/15   Gwyneth RevelsJustin Fowler, DPM   Meds Ordered and Administered this Visit  Medications - No data to display  BP 135/86 mmHg  Pulse 114  Temp(Src) 98 F (36.7 C) (Oral)  Resp 18  Ht 5\' 9"  (1.753 m)  Wt 175 lb (79.379 kg)  BMI 25.83 kg/m2  SpO2 100% No data found.  Physical Exam   Skin examination of the left wrist demonstrated severe swelling and a noticeable,  dorsal deformity. The patient is able to make a full composite fist with moderate to severe pain. Flexion and extension exercises were not evaluated. The patient's intent to light touch of the dorsal and volar aspect of the hand. No open wound is noted.  Skin examination of the left elbow demonstrates a small superficial laceration over the olecranon. The patient has mild tenderness with palpation of the olecranon, no pain with tenderness with palpation of the medial lateral epicondyle. The patient is able to reach -5 extension with  my pain and hundred and 15 flexion without pain. The patient is able to fully supinate and pronate the left wrist with moderate pain.  No pain with palpation over the ulnar nerve.  ED Course  Procedures (including critical care time)  Labs Review Labs Reviewed - No data to display  Imaging Review Dg Elbow Complete Left  12/27/2015  CLINICAL DATA:  Acute left elbow pain after fall off ladder today. Initial encounter. EXAM: LEFT ELBOW - COMPLETE 3+ VIEW COMPARISON:  None. FINDINGS: There is no evidence of fracture, dislocation, or joint effusion. There is no evidence of arthropathy or other focal bone abnormality. Soft tissues are unremarkable. IMPRESSION: Normal left elbow. Electronically Signed   By: Lupita Raider, M.D.   On: 12/27/2015 15:32   Dg Wrist Complete Left  12/27/2015  CLINICAL DATA:  Status post fall.  Left wrist and elbow pain. EXAM: LEFT WRIST - COMPLETE 3+ VIEW COMPARISON:  None. FINDINGS: There is a mildly comminuted fracture of the distal left radial metaphysis extending to the articular surface. There is no other fracture or dislocation. The alignment is anatomic. There is soft tissue swelling around the wrist. IMPRESSION: Mildly comminuted fracture of the distal left radial metaphysis extending to the articular surface. Electronically Signed   By: Elige Ko   On: 12/27/2015 15:30    MDM   1. Distal radius fracture, right, closed, initial encounter   2. Elbow strain, right, initial encounter    1.  Treatment options were discussed today with the patient. 2.  Pt was placed into a left sugar-tong splint, will contact [facility] on Monday for an appointment, patient has already been seeing Dedra Skeens, PA-C 3.  Ibuprofen for inflammation and Norco for breakthrough pain. 4.  Return to [facility] if symptoms worsen or new symptoms develop.    Anson [place], [place] 12/27/15 [phone]

## 2015-12-27 NOTE — ED Notes (Signed)
Patient complains of left wrist pain from a fall today about 1 hr ago and he fell approx. 3 feet. His hand hit a concrete slab upon contact.

## 2015-12-31 ENCOUNTER — Other Ambulatory Visit: Payer: Self-pay | Admitting: Unknown Physician Specialty

## 2015-12-31 DIAGNOSIS — M2392 Unspecified internal derangement of left knee: Secondary | ICD-10-CM

## 2016-01-14 ENCOUNTER — Ambulatory Visit: Payer: Managed Care, Other (non HMO)

## 2016-02-06 ENCOUNTER — Ambulatory Visit
Admission: RE | Admit: 2016-02-06 | Discharge: 2016-02-06 | Disposition: A | Discharge status: Home / Self Care | Payer: Managed Care, Other (non HMO) | Source: Ambulatory Visit | Attending: Unknown Physician Specialty | Admitting: Unknown Physician Specialty

## 2016-02-06 DIAGNOSIS — M25562 Pain in left knee: Secondary | ICD-10-CM | POA: Diagnosis present

## 2016-02-06 DIAGNOSIS — M2242 Chondromalacia patellae, left knee: Secondary | ICD-10-CM | POA: Diagnosis not present

## 2016-02-06 DIAGNOSIS — M2392 Unspecified internal derangement of left knee: Secondary | ICD-10-CM

## 2016-03-14 DIAGNOSIS — I82419 Acute embolism and thrombosis of unspecified femoral vein: Secondary | ICD-10-CM

## 2016-03-14 HISTORY — DX: Acute embolism and thrombosis of unspecified femoral vein: I82.419

## 2016-04-07 ENCOUNTER — Encounter (INDEPENDENT_AMBULATORY_CARE_PROVIDER_SITE_OTHER): Payer: Self-pay | Admitting: Vascular Surgery

## 2016-04-07 ENCOUNTER — Ambulatory Visit (INDEPENDENT_AMBULATORY_CARE_PROVIDER_SITE_OTHER): Payer: Managed Care, Other (non HMO) | Admitting: Vascular Surgery

## 2016-04-07 ENCOUNTER — Other Ambulatory Visit (INDEPENDENT_AMBULATORY_CARE_PROVIDER_SITE_OTHER): Payer: Self-pay | Admitting: Vascular Surgery

## 2016-04-07 ENCOUNTER — Encounter (INDEPENDENT_AMBULATORY_CARE_PROVIDER_SITE_OTHER): Payer: Self-pay

## 2016-04-07 ENCOUNTER — Ambulatory Visit (INDEPENDENT_AMBULATORY_CARE_PROVIDER_SITE_OTHER): Payer: Managed Care, Other (non HMO)

## 2016-04-07 ENCOUNTER — Other Ambulatory Visit (INDEPENDENT_AMBULATORY_CARE_PROVIDER_SITE_OTHER): Payer: Self-pay | Admitting: Family Medicine

## 2016-04-07 VITALS — BP 134/88 | HR 82 | Resp 17 | Ht 69.0 in | Wt 170.0 lb

## 2016-04-07 DIAGNOSIS — F17200 Nicotine dependence, unspecified, uncomplicated: Secondary | ICD-10-CM | POA: Insufficient documentation

## 2016-04-07 DIAGNOSIS — M79606 Pain in leg, unspecified: Secondary | ICD-10-CM | POA: Diagnosis not present

## 2016-04-07 DIAGNOSIS — M79605 Pain in left leg: Secondary | ICD-10-CM

## 2016-04-07 DIAGNOSIS — I82432 Acute embolism and thrombosis of left popliteal vein: Secondary | ICD-10-CM | POA: Insufficient documentation

## 2016-04-07 DIAGNOSIS — M7989 Other specified soft tissue disorders: Secondary | ICD-10-CM

## 2016-04-07 DIAGNOSIS — R600 Localized edema: Secondary | ICD-10-CM

## 2016-04-07 NOTE — Progress Notes (Signed)
Subjective:    Patient ID: Sean LeechVictor Larocca, male    DOB: 09/14/1970, 45 y.o.   MRN: [id number] Chief Complaint  Patient presents with  . New Patient (Initial Visit)   Presents as new patient referred by Dr. Ludwig ClarksSantayana for lower extremity pain and swelling. Patient is experiencing progressively worsening swelling of his left knee over the last few weeks associated with pain especially with ambulation. Symptoms worse in left extremity when compared to right. He does have a history of "fracturing his right leg in seven places" a few years back. The patient was supposed to undergo a left lower extremity duplex today to rule out reflux however was found to have acute to subacute DVT of the left popliteal, gastrocnemius, peroneal and posterior tibial veins. He was also noted to have an acute to subacute SVT in the small saphenous vein. He denies any SOB or chest pain. Denies any recent of past trauma to his left lower extremity.    Review of Systems  Constitutional: Negative.   HENT: Negative.   Eyes: Negative.   Respiratory: Negative.   Cardiovascular: Positive for leg swelling (Left Knee).  Gastrointestinal: Negative.   Endocrine: Negative.   Genitourinary: Negative.   Musculoskeletal:       Left Leg Pain  Skin: Negative.   Allergic/Immunologic: Negative.   Neurological: Negative.   Hematological: Negative.   Psychiatric/Behavioral: Negative.       Objective:   Physical Exam  Constitutional: He is oriented to person, place, and time. He appears well-developed and well-nourished.  HENT:  Head: Normocephalic and atraumatic.  Eyes: Conjunctivae and EOM are normal. Pupils are equal, round, and reactive to light.  Neck: Normal range of motion.  Cardiovascular: Normal rate, regular rhythm, normal heart sounds and intact distal pulses.   Pulses:      Radial pulses are 2+ on the right side, and 2+ on the left side.       Popliteal pulses are 2+ on the right side, and 2+ on the left side.     Dorsalis pedis pulses are 2+ on the right side, and 2+ on the left side.       Posterior tibial pulses are 2+ on the right side, and 2+ on the left side.  Pulmonary/Chest: Effort normal and breath sounds normal.  Abdominal: Soft. Bowel sounds are normal.  Musculoskeletal: Normal range of motion. He exhibits edema (Mild Left Lower Extremity Edema).  Neurological: He is alert and oriented to person, place, and time.  Skin: Skin is warm and dry.  Psychiatric: He has a normal mood and affect. His behavior is normal. Judgment and thought content normal.   BP 134/88   Pulse 82   Resp 17   Ht 5\' 9"  (1.753 m)   Wt 170 lb (77.1 kg)   BMI 25.10 kg/m   History reviewed. No pertinent past medical history.  Social History   Social History  . Marital status: Single    Spouse name: N/A  . Number of children: N/A  . Years of education: N/A   Occupational History  . Not on file.   Social History Main Topics  . Smoking status: Current Every Day Smoker    Packs/day: 1.00    Types: Cigarettes  . Smokeless tobacco: Current User  . Alcohol use 7.2 oz/week    12 Cans of beer per week  . Drug use: No  . Sexual activity: Not on file   Other Topics Concern  . Not on file  Social History Narrative  . No narrative on file   Past Surgical History:  Procedure Laterality Date  . APPENDECTOMY    . CYST EXCISION  2000  . ORIF ANKLE FRACTURE Right 01/01/2015   Procedure: OPEN REDUCTION INTERNAL FIXATION (ORIF) CALCANEUS;  Surgeon: Gwyneth Revels, DPM;  Location: [facility];  Service: Podiatry;  Laterality: Right;  POPLITEAL   History reviewed. No pertinent family history.  No Known Allergies     Assessment & Plan:  Presents as new patient referred by Dr. Ludwig Clarks for lower extremity pain and swelling. Patient is experiencing progressively worsening swelling of his left knee over the last few weeks associated with pain especially with ambulation. Symptoms worse in left extremity  when compared to right. He does have a history of "fracturing his right leg in seven places" a few years back. The patient was supposed to undergo a left lower extremity duplex today to rule out reflux however was found to have acute to subacute DVT of the left popliteal, gastrocnemius, peroneal and posterior tibial veins. He was also noted to have an acute to subacute SVT in the small saphenous vein. He denies any SOB or chest pain.   1. Acute deep vein thrombosis (DVT) of popliteal vein of left lower extremity (HCC) - New Patient with incidental finding of non-provoked left lower extremity DVT. Patient started on Eliquis 5mg  PO BID. Patient to return in one week for repeat DVT study to assess for propagation. No compression stockings in acute phase. Encouraged to seek medical attention immediately if he experiences SOB or chest pain.   2. Pain and swelling of lower extremity, unspecified laterality - New Most likely due to DVT, however patient will also be worked up for PAD with ABI and lower extremity arterial duplex in near future.  3. Tobacco dependence - Stable I have discussed with the patient the role of tobacco in the pathogenesis of atherosclerosis and its effect on the progression of the disease, impact on the durability of interventions and its limitations on the formation of collateral pathways. I have recommended absolute tobacco cessation. I have discussed various options available for assistance with tobacco cessation including over the counter methods (Nicotine gum, patch and lozenges). We also discussed prescription options (Chantix, Nicotine Inhaler / Nasal Spray). The patient is not interested in pursuing any prescription tobacco cessation options at this time. The patient voices their understanding.   Current Outpatient Prescriptions on File Prior to Visit  Medication Sig Dispense Refill  . celecoxib (CELEBREX) 100 MG capsule Take 100 mg by mouth 2 (two) times daily.    Marland Kitchen etodolac  (LODINE) 500 MG tablet Take 500 mg by mouth 2 (two) times daily.    Marland Kitchen HYDROcodone-acetaminophen (NORCO) 5-325 MG tablet Take 1 tablet by mouth every 6 (six) hours as needed for moderate pain. (Patient not taking: Reported on 04/07/2016) 20 tablet 0  . mupirocin cream (BACTROBAN) 2 % Apply 1 application topically 2 (two) times daily. For 5-7 days (Patient not taking: Reported on 04/07/2016) 15 g 0  . naproxen sodium (ANAPROX) 220 MG tablet Take 220 mg by mouth 2 (two) times daily with a meal.    . oxyCODONE-acetaminophen (PERCOCET) 7.5-325 MG per tablet Take 1 tablet by mouth every 4 (four) hours as needed for severe pain. (Patient not taking: Reported on 04/07/2016) 30 tablet 0  . traMADol (ULTRAM) 50 MG tablet Take by mouth every 6 (six) hours as needed.     No current facility-administered medications on file prior  to visit.    There are no Patient Instructions on file for this visit. Return in about 1 week (around 04/14/2016) for Follow Up DVT Study.  Kwanza Cancelliere A Advika Mclelland, PA-C

## 2016-04-08 ENCOUNTER — Encounter (INDEPENDENT_AMBULATORY_CARE_PROVIDER_SITE_OTHER): Payer: Self-pay | Admitting: Vascular Surgery

## 2016-04-08 ENCOUNTER — Ambulatory Visit (INDEPENDENT_AMBULATORY_CARE_PROVIDER_SITE_OTHER): Payer: Managed Care, Other (non HMO)

## 2016-04-08 ENCOUNTER — Ambulatory Visit (INDEPENDENT_AMBULATORY_CARE_PROVIDER_SITE_OTHER): Payer: Managed Care, Other (non HMO) | Admitting: Vascular Surgery

## 2016-04-08 VITALS — BP 128/81 | HR 77 | Resp 17 | Ht 69.0 in | Wt 170.0 lb

## 2016-04-08 DIAGNOSIS — M79605 Pain in left leg: Secondary | ICD-10-CM

## 2016-04-08 DIAGNOSIS — M7989 Other specified soft tissue disorders: Secondary | ICD-10-CM

## 2016-04-08 DIAGNOSIS — I82432 Acute embolism and thrombosis of left popliteal vein: Secondary | ICD-10-CM

## 2016-04-08 DIAGNOSIS — F17200 Nicotine dependence, unspecified, uncomplicated: Secondary | ICD-10-CM | POA: Diagnosis not present

## 2016-04-08 DIAGNOSIS — M79606 Pain in leg, unspecified: Secondary | ICD-10-CM | POA: Diagnosis not present

## 2016-04-08 NOTE — Progress Notes (Signed)
Subjective:    Patient ID: Sean Chen, male    DOB: 23-Oct-1970, 45 y.o.   MRN: [id number] Chief Complaint  Patient presents with  . Follow-up   Patient returns today to review vascular studies. Seen yesterday and found incidental DVT in left lower extremity. Patient being worked up for lower extremity pain and swelling. The patient underwent a ABI which was notable for RIGHT: 1.26 and LEFT: ankle pressures not obtained due to DVT however PPG waveforms appear in normal limits. A bilateral lower extremity arterial duplex was notable for triphasic blood flow distally with no evidence of stenosis. Patient has started taking Eliquis. No SOB or chest pain.    Review of Systems Review of Systems  Constitutional: Negative.   HENT: Negative.   Eyes: Negative.   Respiratory: Negative.   Cardiovascular: Positive for leg swelling (Left Knee).  Gastrointestinal: Negative.   Endocrine: Negative.   Genitourinary: Negative.   Musculoskeletal:       Left Leg Pain  Skin: Negative.   Allergic/Immunologic: Negative.   Neurological: Negative.   Hematological: Negative.   Psychiatric/Behavioral: Negative.      Objective:   Physical Exam Constitutional: He is oriented to person, place, and time. He appears well-developed and well-nourished.  HENT:  Head: Normocephalic and atraumatic.  Eyes: Conjunctivae and EOM are normal. Pupils are equal, round, and reactive to light.  Neck: Normal range of motion.  Cardiovascular: Normal rate, regular rhythm, normal heart sounds and intact distal pulses.   Pulses:      Radial pulses are 2+ on the right side, and 2+ on the left side.       Popliteal pulses are 2+ on the right side, and 2+ on the left side.       Dorsalis pedis pulses are 2+ on the right side, and 2+ on the left side.       Posterior tibial pulses are 2+ on the right side, and 2+ on the left side.  Pulmonary/Chest: Effort normal and breath sounds normal.  Abdominal: Soft. Bowel sounds are  normal.  Musculoskeletal: Normal range of motion. He exhibits edema (Mild Left Lower Extremity Edema).  Neurological: He is alert and oriented to person, place, and time.  Skin: Skin is warm and dry.  Psychiatric: He has a normal mood and affect. His behavior is normal. Judgment and thought content normal.   BP 134/88   Pulse 82   Resp 17   Ht 5\' 9"  (1.753 m)   Wt 170 lb (77.1 kg)   BMI 25.10 kg/m   History reviewed. No pertinent past medical history.  BP 128/81   Pulse 77   Resp 17   Ht 5\' 9"  (1.753 m)   Wt 170 lb (77.1 kg)   BMI 25.10 kg/m   No past medical history on file.  Social History   Social History  . Marital status: Single    Spouse name: N/A  . Number of children: N/A  . Years of education: N/A   Occupational History  . Not on file.   Social History Main Topics  . Smoking status: Current Every Day Smoker    Packs/day: 1.00    Types: Cigarettes  . Smokeless tobacco: Current User  . Alcohol use 7.2 oz/week    12 Cans of beer per week  . Drug use: No  . Sexual activity: Not on file   Other Topics Concern  . Not on file   Social History Narrative  . No narrative on file  Past Surgical History:  Procedure Laterality Date  . APPENDECTOMY    . CYST EXCISION  2000  . ORIF ANKLE FRACTURE Right 01/01/2015   Procedure: OPEN REDUCTION INTERNAL FIXATION (ORIF) CALCANEUS;  Surgeon: Gwyneth RevelsJustin Fowler, DPM;  [facility];  Service: Podiatry;  Laterality: Right;  POPLITEAL   No family history on file.  No Known Allergies     Assessment & Plan:  Patient returns today to review vascular studies. Seen yesterday and found incidental DVT in left lower extremity. Patient being worked up for lower extremity pain and swelling. The patient underwent a ABI which was notable for RIGHT: 1.26 and LEFT: ankle pressures not obtained due to DVT however PPG waveforms appear in normal limits. A bilateral lower extremity arterial duplex was notable for  triphasic blood flow distally with no evidence of stenosis. Patient has started taking Eliquis. No SOB or chest pain.   1. Tobacco dependence - Stable I have discussed with the patient the role of tobacco in the pathogenesis of atherosclerosis and its effect on the progression of the disease, impact on the durability of interventions and its limitations on the formation of collateral pathways. I have recommended absolute tobacco cessation. I have discussed various options available for assistance with tobacco cessation including over the counter methods (Nicotine gum, patch and lozenges). We also discussed prescription options (Chantix, Nicotine Inhaler / Nasal Spray). The patient is not interested in pursuing any prescription tobacco cessation options at this time. The patient voices their understanding.   2. Pain and swelling of lower extremity, unspecified laterality - Stable No remarkable PAD found on ABI or duplex. 2+ pedal pulses. Pain most likely due to left lower extremity DVT.   3. Acute deep vein thrombosis (DVT) of popliteal vein of left lower extremity (HCC) - Stable Patient with incidental finding of non-provoked left lower extremity DVT. Patient started on Eliquis 5mg  PO BID. Patient to return in one week for repeat DVT study to assess for propagation. No compression stockings in acute phase. Encouraged to seek medical attention immediately if he experiences SOB or chest pain.   Current Outpatient Prescriptions on File Prior to Visit  Medication Sig Dispense Refill  . celecoxib (CELEBREX) 100 MG capsule Take 100 mg by mouth 2 (two) times daily.    Marland Kitchen. etodolac (LODINE) 500 MG tablet Take 500 mg by mouth 2 (two) times daily.    Marland Kitchen. HYDROcodone-acetaminophen (NORCO) 5-325 MG tablet Take 1 tablet by mouth every 6 (six) hours as needed for moderate pain. 20 tablet 0  . mupirocin cream (BACTROBAN) 2 % Apply 1 application topically 2 (two) times daily. For 5-7 days 15 g 0  . naproxen sodium  (ANAPROX) 220 MG tablet Take 220 mg by mouth 2 (two) times daily with a meal.    . oxyCODONE-acetaminophen (PERCOCET) 7.5-325 MG per tablet Take 1 tablet by mouth every 4 (four) hours as needed for severe pain. 30 tablet 0  . traMADol (ULTRAM) 50 MG tablet Take by mouth every 6 (six) hours as needed.     No current facility-administered medications on file prior to visit.     There are no Patient Instructions on file for this visit. Return in about 1 week (around 04/15/2016) for Repeat DVT study.   Renea Schoonmaker A Lynelle Weiler, PA-C

## 2016-04-15 ENCOUNTER — Ambulatory Visit (INDEPENDENT_AMBULATORY_CARE_PROVIDER_SITE_OTHER): Payer: Managed Care, Other (non HMO)

## 2016-04-15 ENCOUNTER — Ambulatory Visit (INDEPENDENT_AMBULATORY_CARE_PROVIDER_SITE_OTHER): Payer: Managed Care, Other (non HMO) | Admitting: Vascular Surgery

## 2016-04-15 ENCOUNTER — Encounter (INDEPENDENT_AMBULATORY_CARE_PROVIDER_SITE_OTHER): Payer: Self-pay | Admitting: Vascular Surgery

## 2016-04-15 VITALS — BP 139/84 | HR 83 | Resp 16 | Ht 69.0 in | Wt 171.0 lb

## 2016-04-15 DIAGNOSIS — I82432 Acute embolism and thrombosis of left popliteal vein: Secondary | ICD-10-CM

## 2016-04-15 DIAGNOSIS — M79606 Pain in leg, unspecified: Secondary | ICD-10-CM | POA: Diagnosis not present

## 2016-04-15 DIAGNOSIS — M7989 Other specified soft tissue disorders: Secondary | ICD-10-CM

## 2016-04-15 DIAGNOSIS — F17200 Nicotine dependence, unspecified, uncomplicated: Secondary | ICD-10-CM

## 2016-04-15 NOTE — Progress Notes (Signed)
Subjective:    Patient ID: Idelle LeechVictor Sweney, male    DOB: [birth year], 45 y.o.   MRN: [id number] Chief Complaint  Patient presents with  . Re-evaluation    Ultrasound, followup   Patient presents for a week follow up after an incidental finding of a non-provoked left lower extremity DVT. Patient started on Eliquis 5mg  PO BID. Patient returns today for repeat DVT study to assess for propagation. No propagation found on duplex.    Review of Systems  Constitutional: Negative.   HENT: Negative.   Eyes: Negative.   Respiratory: Negative.   Cardiovascular: Positive for leg swelling.  Gastrointestinal: Negative.   Endocrine: Negative.   Genitourinary: Negative.   Musculoskeletal: Negative.   Skin: Negative.   Allergic/Immunologic: Negative.   Neurological: Negative.   Hematological: Negative.   Psychiatric/Behavioral: Negative.       Objective:   Physical Exam Constitutional: He is oriented to person, place, and time. He appears well-developed and well-nourished.  HENT:  Head: Normocephalic and atraumatic.  Eyes: Conjunctivae and EOM are normal. Pupils are equal, round, and reactive to light.  Neck: Normal range of motion.  Cardiovascular: Normal rate, regular rhythm, normal heart sounds and intact distal pulses.   Pulses:      Radial pulses are 2+ on the right side, and 2+ on the left side.       Popliteal pulses are 2+ on the right side, and 2+ on the left side.       Dorsalis pedis pulses are 2+ on the right side, and 2+ on the left side.       Posterior tibial pulses are 2+ on the right side, and 2+ on the left side.  Pulmonary/Chest: Effort normal and breath sounds normal.  Abdominal: Soft. Bowel sounds are normal.  Musculoskeletal: Normal range of motion. He exhibits edema (Mild Left Lower Extremity Edema).  Neurological: He is alert and oriented to person, place, and time.  Skin: Skin is warm and dry.  Psychiatric: He has a normal mood and affect. His behavior is normal.  Judgment and thought content normal.   BP 139/84 (BP Location: Right Arm)   Pulse 83   Resp 16   Ht 5\' 9"  (1.753 m)   Wt 171 lb (77.6 kg)   BMI 25.25 kg/m   Past Medical History:  Diagnosis Date  . Peripheral vascular disease [facility](HCC)    Social History   Social History  . Marital status: Single    Spouse name: N/A  . Number of children: N/A  . Years of education: N/A   Occupational History  . Not on file.   Social History Main Topics  . Smoking status: Current Every Day Smoker    Packs/day: 1.00    Types: Cigarettes  . Smokeless tobacco: Current User  . Alcohol use 7.2 oz/week    12 Cans of beer per week  . Drug use: No  . Sexual activity: Not on file   Other Topics Concern  . Not on file   Social History Narrative  . No narrative on file   Past Surgical History:  Procedure Laterality Date  . APPENDECTOMY    . CYST EXCISION  2000  . ORIF ANKLE FRACTURE Right 01/01/2015   Procedure: OPEN REDUCTION INTERNAL FIXATION (ORIF) CALCANEUS;  Surgeon: Gwyneth RevelsJustin Fowler, DPM;  [facility];  Service: Podiatry;  Laterality: Right;  POPLITEAL   Family History  Problem Relation Age of Onset  . Family history unknown: Yes   No Known  Allergies     Assessment & Plan:  Patient presents for a week follow up after an incidental finding of a non-provoked left lower extremity DVT. Patient started on Eliquis 5mg  PO BID. Patient returns today for repeat DVT study to assess for propagation. No propagation found on duplex.   1. Tobacco dependence - Stable I have discussed with the patient the role of tobacco in the pathogenesis of atherosclerosis and its effect on the progression of the disease, impact on the durability of interventions and its limitations on the formation of collateral pathways. I have recommended absolute tobacco cessation. I have discussed various options available for assistance with tobacco cessation including over the counter methods (Nicotine gum,  patch and lozenges). We also discussed prescription options (Chantix, Nicotine Inhaler / Nasal Spray). The patient is not interested in pursuing any prescription tobacco cessation options at this time. The patient voices their understanding.   2. Pain and swelling of lower extremity, unspecified laterality - Stable Due to DVT. Elevation for symptomatic relief.   3. Acute deep vein thrombosis (DVT) of popliteal vein of left lower extremity (HCC) - Stable No propagation found on duplex. Continue Eliquis BID. Follow up in three months for repeat LLE DVT study.   Current Outpatient Prescriptions on File Prior to Visit  Medication Sig Dispense Refill  . traMADol (ULTRAM) 50 MG tablet Take by mouth every 6 (six) hours as needed.     No current facility-administered medications on file prior to visit.     There are no Patient Instructions on file for this visit. Return in about 3 months (around 07/16/2016) for Repeat DVT Study.   Haeley Fordham A Loletta Harper, PA-C

## 2016-04-22 ENCOUNTER — Inpatient Hospital Stay: Payer: Managed Care, Other (non HMO) | Attending: Internal Medicine | Admitting: Internal Medicine

## 2016-04-22 VITALS — BP 167/78 | HR 114 | Temp 97.4°F | Ht 69.0 in | Wt 173.8 lb

## 2016-04-22 DIAGNOSIS — I8002 Phlebitis and thrombophlebitis of superficial vessels of left lower extremity: Secondary | ICD-10-CM | POA: Diagnosis not present

## 2016-04-22 DIAGNOSIS — M79671 Pain in right foot: Secondary | ICD-10-CM | POA: Diagnosis not present

## 2016-04-22 DIAGNOSIS — Z8781 Personal history of (healed) traumatic fracture: Secondary | ICD-10-CM | POA: Diagnosis not present

## 2016-04-22 DIAGNOSIS — Z9049 Acquired absence of other specified parts of digestive tract: Secondary | ICD-10-CM | POA: Diagnosis not present

## 2016-04-22 DIAGNOSIS — Z79899 Other long term (current) drug therapy: Secondary | ICD-10-CM | POA: Insufficient documentation

## 2016-04-22 DIAGNOSIS — Z7901 Long term (current) use of anticoagulants: Secondary | ICD-10-CM | POA: Diagnosis not present

## 2016-04-22 DIAGNOSIS — I82432 Acute embolism and thrombosis of left popliteal vein: Secondary | ICD-10-CM | POA: Insufficient documentation

## 2016-04-22 DIAGNOSIS — M2550 Pain in unspecified joint: Secondary | ICD-10-CM | POA: Diagnosis not present

## 2016-04-22 DIAGNOSIS — G8929 Other chronic pain: Secondary | ICD-10-CM | POA: Insufficient documentation

## 2016-04-22 DIAGNOSIS — I739 Peripheral vascular disease, unspecified: Secondary | ICD-10-CM | POA: Insufficient documentation

## 2016-04-22 NOTE — Progress Notes (Signed)
Patient here for initial evaluation. Has history of knee, foot and shoulder injury. Recent ultrasound revealed dvt in left leg.

## 2016-04-29 NOTE — Progress Notes (Signed)
[facility]  [phone] [phone]) [phone]  ID: Sean LeechVictor Chen OB: 1971/01/27  MR#: [id number]  AOZ#:[id number]#:[id number]  Patient Care Team: Sean MullGloria Patricia Santayana, DO as PCP - General (Family Medicine)  CHIEF COMPLAINT:  DVT left leg 03/2016  HPI: This is a pleasant 45 year old gentleman who has been referred by his primary care physician for thrombophilia workup due to a recent episode of acute DVT involving the left leg.  The patient was apparently complaining of left knee swelling for several months and on 04/13/2016 he went to see Dr. Ludwig Chen as the swelling extended down to the left lower leg. A Doppler showed an acute or subacute DVT of the left popliteal, peroneal and posterior tibial veins. He was seen by vascular surgery who began Eliquis.  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  He takes tramadol for pain He has been treated for left knee pain since Aug 2017  Patient had an MRI done on February 06, 2016 that showed no meniscal tear but chondromalacia patella. He had physical therapy and wears a supportive knee brace but he continues to have pain with walking and instability with a giving out sensation.   He had a cortisone injection several times with little relief.. Had a course of prednisone with no relief. He denies any back pain and no numbness or tingling.  No prior injury to the knee.  REVIEW OF SYSTEMS:   Review of Systems  Constitutional: Negative.   HENT: Negative.   Eyes: Negative.   Respiratory: Negative.   Cardiovascular: Negative.   Gastrointestinal: Negative.   Genitourinary: Negative.   Musculoskeletal: Positive for joint pain (left knee and right foot pain).  Skin: Negative.   All other systems reviewed and are negative.   As per HPI. Otherwise,  a complete review of systems is negative.  PAST MEDICAL HISTORY: Past Medical History:  Diagnosis Date  . Peripheral vascular disease (HCC)     PAST SURGICAL HISTORY: Past Surgical History:  Procedure Laterality Date  . APPENDECTOMY    . CYST EXCISION  2000  . ORIF ANKLE FRACTURE Right 01/01/2015   Procedure: OPEN REDUCTION INTERNAL FIXATION (ORIF) CALCANEUS;  Surgeon: Gwyneth RevelsJustin Chen, DPM;  Location[facility];  Service: Podiatry;  Laterality: Right;  POPLITEAL    FAMILY HISTORY: Family History  Problem Relation Age of Onset  . Family history unknown: Yes    No Known Allergies  Vitals:   04/22/16 1354  BP: (!) 167/78  Pulse: (!) 114  Temp: 97.4 F (36.3 C)     Body mass index is 25.67 kg/m.   1.96 meters squared    Physical Exam  Constitutional: He is oriented to person, place, and time. He appears well-developed and well-nourished.  HENT:  Head: Normocephalic and atraumatic.  Eyes: EOM are normal. Pupils are equal, round, and reactive to light.  Neck: Normal range of motion. Neck supple.  Cardiovascular: Normal rate and regular rhythm.   Pulmonary/Chest: Effort normal and breath sounds normal.  Abdominal: Soft. Bowel sounds are normal.  Musculoskeletal: Normal range of motion. He exhibits edema (left calf lsightly edematous, non tender), tenderness (ROM left knee is limited by pain) and deformity (left knee is slightly swollen,).  Right ankle shows scarrng from repaired fracture  He limps while walking  Lymphadenopathy:    He has no cervical adenopathy.  Neurological: He is alert and oriented to person, place, and time. No cranial nerve deficit or sensory deficit. Coordination normal.  Skin: Skin is warm. No rash noted.  Psychiatric: He has a normal mood and affect. His behavior is normal. Judgment and thought content normal.  Nursing note and vitals reviewed.   Results review ( External and Internal):  ANA was negative back in August 2017 rheumatoid  factor was negative C-reactive protein was normal His CBC showed a hemoglobin of 15.4 platelets 208 white cell count is 6.9. CMP was normal  MRI of the left knee was completed on February 06, 2016. The MRI revealed no meniscus or ligamentous tear. There is positive edema and chondromalacia within the patella and retropatellar region.   Lower extremity venous duplex exam from 04/07/2016 performed at the [facility], shows a nonocclusive acute to subacute deep vein thrombosis of the left popliteal vein. There was an occlusive DVT of the left gastrocnemius vein peroneal vein and posterior tibial vein. Obstructive thrombophlebitis of the left small saphenous vein  Arterial Doppler of the extremities was normal.  Impression and plan: DVT involving the left leg this in the setting of chronic left knee pain and swelling. He has had steroid injections into the left knee he has been following with also for at least 4-5 months. He had a knee brace intermittently. The pain restricts her mobility of his left knee.  A repeat venous doppler of the left leg on 04/15/2016 does not show progression  He is tolerating Eliquis without any bleeding or bruising.  Given that he is on Eliquis now, some of the workup for thrombophilia such as lupus anticoagulant, protein C and S and antithrombin III activity, factor VIII levels may not be reliable.  Although Factor V Leiden, Prothrombin gene mutation testing is not affected by him being on anticoagulation, I would prefer to do a comprehensive testing once he is off all anticoagulation.  Furthermore, there is data indicating that a persistently elevated d-dimers is a marker a high risk for recurrence if anticoagulation is stopped.  I've explained  to him that test results will not alter management of his DVT for the first 6 months.  He will require a minimum of 6 months of anticoagulation. By the end of six months, If his knee pain and swelling  are resolved, mobility improved, d-dimer's are not elevated, and thrombophilia workup is negative, he may not need to continue beyond 6 months.  I'd like to have him return  in approximately 3 months. At that time we will check his d-dimer's AND assess the status of knee.   He agrees to the plan. He will continue Eliquis as prescribed by primary care. He will contact us for any interim problems such as bleeding or new or progression of leg swelling.  Most importanlly, he will need to work with ortho and physical therapy to find ways to resolve the problem and improve knee mobility.  Patient expressed understanding and was in agreement with this plan. He also understands that He can call clinic at any time with any questions, concerns, or complaints.   ----------------------------------------------------------------------------------------------------------------------------  This note was generated in part with voice recognition software and I apologize for any typographical errors that were not detected and corrected.    Towana BadgerSirisha Shia Eber, MD   04/29/2016 2:10 PM

## 2016-07-08 ENCOUNTER — Encounter (INDEPENDENT_AMBULATORY_CARE_PROVIDER_SITE_OTHER): Payer: Managed Care, Other (non HMO)

## 2016-07-08 ENCOUNTER — Ambulatory Visit (INDEPENDENT_AMBULATORY_CARE_PROVIDER_SITE_OTHER): Payer: Managed Care, Other (non HMO) | Admitting: Vascular Surgery

## 2016-07-14 ENCOUNTER — Telehealth: Payer: Self-pay | Admitting: *Deleted

## 2016-07-14 NOTE — Telephone Encounter (Signed)
Called patient and LVM for patient that the last time he was in clinic he saw Dr. FijiPeru who recommended he return in 3 months regarding his DVT as well as additional labs and reassessment of his knee.  Dr. Merlene Pullingorcoran has not seen him and will not discuss his problems over the phone.  Patient advised to keep clinical appointment on 07-21-16 and at that time any questions or concerns he may have can be addressed

## 2016-07-14 NOTE — Telephone Encounter (Signed)
-----   Message from Lane HackerLisa R Torain sent at 07/13/2016  8:29 AM EST ----- Contact: [phone] Per pt would like to know why he need to return to see MD. Please call pt.

## 2016-07-19 ENCOUNTER — Telehealth: Payer: Self-pay | Admitting: *Deleted

## 2016-07-19 NOTE — Telephone Encounter (Signed)
Called patient and discussed appointment with him for Wednesday because he was considering cancelling appointment. Discussed with patient the importance of appt and getting labs to determine the origination of his dvt, voiced understanding and agreed to keep appointment.

## 2016-07-20 ENCOUNTER — Ambulatory Visit (INDEPENDENT_AMBULATORY_CARE_PROVIDER_SITE_OTHER): Payer: Self-pay | Admitting: Vascular Surgery

## 2016-07-20 ENCOUNTER — Ambulatory Visit (INDEPENDENT_AMBULATORY_CARE_PROVIDER_SITE_OTHER): Payer: Managed Care, Other (non HMO)

## 2016-07-20 DIAGNOSIS — I82432 Acute embolism and thrombosis of left popliteal vein: Secondary | ICD-10-CM

## 2016-07-21 ENCOUNTER — Inpatient Hospital Stay: Payer: Managed Care, Other (non HMO) | Attending: Internal Medicine | Admitting: Hematology and Oncology

## 2016-07-21 ENCOUNTER — Inpatient Hospital Stay: Payer: Managed Care, Other (non HMO)

## 2016-07-21 ENCOUNTER — Encounter: Payer: Self-pay | Admitting: Hematology and Oncology

## 2016-07-21 VITALS — BP 130/80 | HR 114 | Temp 98.9°F | Resp 18 | Wt 171.5 lb

## 2016-07-21 DIAGNOSIS — I739 Peripheral vascular disease, unspecified: Secondary | ICD-10-CM | POA: Insufficient documentation

## 2016-07-21 DIAGNOSIS — G629 Polyneuropathy, unspecified: Secondary | ICD-10-CM | POA: Diagnosis not present

## 2016-07-21 DIAGNOSIS — Z79899 Other long term (current) drug therapy: Secondary | ICD-10-CM

## 2016-07-21 DIAGNOSIS — G8929 Other chronic pain: Secondary | ICD-10-CM | POA: Insufficient documentation

## 2016-07-21 DIAGNOSIS — F17210 Nicotine dependence, cigarettes, uncomplicated: Secondary | ICD-10-CM | POA: Diagnosis not present

## 2016-07-21 DIAGNOSIS — Z7901 Long term (current) use of anticoagulants: Secondary | ICD-10-CM | POA: Insufficient documentation

## 2016-07-21 DIAGNOSIS — Z86718 Personal history of other venous thrombosis and embolism: Secondary | ICD-10-CM | POA: Insufficient documentation

## 2016-07-21 DIAGNOSIS — I82432 Acute embolism and thrombosis of left popliteal vein: Secondary | ICD-10-CM

## 2016-07-21 LAB — CBC WITH DIFFERENTIAL/PLATELET
Basophils Absolute: 0.1 10*3/uL (ref 0–0.1)
Basophils Relative: 1 %
Eosinophils Absolute: 0.2 10*3/uL (ref 0–0.7)
Eosinophils Relative: 2 %
HCT: 46.6 % (ref 40.0–52.0)
Hemoglobin: 15.7 g/dL (ref 13.0–18.0)
Lymphocytes Relative: 24 %
Lymphs Abs: 1.8 10*3/uL (ref 1.0–3.6)
MCH: 32.4 pg (ref 26.0–34.0)
MCHC: 33.8 g/dL (ref 32.0–36.0)
MCV: 95.8 fL (ref 80.0–100.0)
Monocytes Absolute: 0.6 10*3/uL (ref 0.2–1.0)
Monocytes Relative: 8 %
Neutro Abs: 5 10*3/uL (ref 1.4–6.5)
Neutrophils Relative %: 65 %
Platelets: 213 10*3/uL (ref 150–440)
RBC: 4.86 MIL/uL (ref 4.40–5.90)
RDW: 12.9 % (ref 11.5–14.5)
WBC: 7.7 10*3/uL (ref 3.8–10.6)

## 2016-07-21 LAB — COMPREHENSIVE METABOLIC PANEL
ALT: 25 U/L (ref 17–63)
AST: 27 U/L (ref 15–41)
Albumin: 4.1 g/dL (ref 3.5–5.0)
Alkaline Phosphatase: 54 U/L (ref 38–126)
Anion gap: 5 (ref 5–15)
BUN: 14 mg/dL (ref 6–20)
CO2: 26 mmol/L (ref 22–32)
Calcium: 9 mg/dL (ref 8.9–10.3)
Chloride: 104 mmol/L (ref 101–111)
Creatinine, Ser: 0.77 mg/dL (ref 0.61–1.24)
GFR calc Af Amer: >60 mL/min (ref >60)
GFR calc non Af Amer: >60 mL/min (ref >60)
Glucose, Bld: 94 mg/dL (ref 65–99)
Potassium: 4.1 mmol/L (ref 3.5–5.1)
Sodium: 135 mmol/L (ref 135–145)
Total Bilirubin: 0.1 mg/dL — ABNORMAL LOW (ref 0.3–1.2)
Total Protein: 7.3 g/dL (ref 6.5–8.1)

## 2016-07-21 LAB — ANTITHROMBIN III: AntiThromb III Func: 105 % (ref 75–120)

## 2016-07-21 NOTE — Progress Notes (Signed)
[facility]  Clinic day:  07/21/2016  Chief Complaint: Sean Chen is a 46 y.o. male with a left lower extremity DVT who is seen for reassessment.  HPI:  The patient states that 20 years ago he injured his left knee in the [employer]  Subsequently, he described at times being weak in the left knee.  MRI of the left knee on 02/06/2016 revealed no meniscal or ligamentous injury, but mild chondromalacia of the medial femorotibial compartment with mild chondromalacia of the patella.  He has worn a brace on his left knee on and off.  Approximately 18 months ago, he fell off a ladder while flooring a roof and fractured his right foot.  He was placed in a splint and boot for 3-4 months.  He was not at bedrest.  Since that time, he has walked with a cane.  He is being considered for surgery to the right foot.  He described a 4 month history of swelling in his left knee and ankle.  He noted increasing swelling and pain with ambulation which prompted medical evaulation.  He was seen by [facility] on 04/07/2016.  Left lower extremity duplex revealed a non-occlusive, acute and sub-acute deep venous thrombosis (DVT) of the left popliteal vein.  He had an occlusive, acute and sub-acute, DVT of the left gastrocnemius, peroneal veins and posterior tibial veins.  He had an obstructive thrombophlebitis of the left small saphenous vein.  He was started on Eliquis.  He notes some mild increased bruising, but no bleeding.  He was seen on 07/20/2016 at [facility] for follow-up imaging.  He was told that "it looked like the Eliquis was working".  He has an appointment with Dr Levora DredgeGregory Schnier in 1 1/2 weeks.  Symptomatically, he denies any weight loss.  He denies any symptoms except for chronic issues associated with his left knee and right foot.  He denies any family history of thrombosis. He notes that he sits for 6-7 hours/day.  He smokes 1 pack of  cigarettes a day.   Past Medical History:  Diagnosis Date  . Peripheral vascular disease [facility](HCC)     Past Surgical History:  Procedure Laterality Date  . APPENDECTOMY    . CYST EXCISION  2000  . ORIF ANKLE FRACTURE Right 01/01/2015   Procedure: OPEN REDUCTION INTERNAL FIXATION (ORIF) CALCANEUS;  Surgeon: Gwyneth RevelsJustin Fowler, DPM;  [facility];  Service: Podiatry;  Laterality: Right;  POPLITEAL    Family History  Problem Relation Age of Onset  . Family history unknown: Yes    Social History:  reports that he has been smoking Cigarettes.  He has been smoking about 1.00 pack per day. He uses smokeless tobacco. He reports that he drinks about 7.2 oz of alcohol per week . He reports that he does not use drugs.  He has smoked 1 pack of cigarettes/day x 30 years.  He drinks 2-3 drinks on 2-3 days/week.  He has no children.  [place] in  [place].  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  The patient is alone today.  Allergies: No Known Allergies  Current Medications: Current Outpatient Prescriptions  Medication Sig Dispense Refill  . apixaban (ELIQUIS) 5 MG TABS tablet Take by mouth.    . traMADol (ULTRAM) 50 MG tablet Take by mouth every 6 (six) hours as needed.     No current facility-administered medications for this visit.  Review of Systems:  GENERAL:  Feels good.  No fevers, sweats or weight loss. PERFORMANCE STATUS (ECOG):  1 HEENT:  No visual changes, runny nose, sore throat, mouth sores or tenderness. Lungs: No shortness of breath or cough.  No hemoptysis. Cardiac:  No chest pain, palpitations, orthopnea, or PND. GI:  No nausea, vomiting, diarrhea, constipation, melena or hematochezia. GU:  No urgency, frequency, dysuria, or hematuria. Musculoskeletal:  Right foot pain.  No back pain.  Left knee issues (see HPI).  No muscle tenderness. Extremities:  No pain or swelling. Skin:  Easy bruising.  Excess bleeding with lipoma removal recently  (while on Eliquis).  No rashes or skin changes. Neuro:  No headache, numbness or weakness, balance or coordination issues. Endocrine:  No diabetes, thyroid issues, hot flashes or night sweats. Psych:  No mood changes, depression or anxiety. Pain:  Right foot pain. Review of systems:  All other systems reviewed and found to be negative.  Physical Exam: Blood pressure 130/80, pulse (!) 114, temperature 98.9 F (37.2 C), temperature source Tympanic, resp. rate 18, weight 171 lb 8.3 oz (77.8 kg). GENERAL:  Well developed, well nourished, gentleman sitting comfortably in the exam room in no acute distress.  He has a cane at his side. MENTAL STATUS:  Alert and oriented to person, place and time. HEAD:  Black hair with graying beard.  Normocephalic, atraumatic, face symmetric, no Cushingoid features. EYES:  Green eyes.  Pupils equal round and reactive to light and accomodation.  No conjunctivitis or scleral icterus. ENT:  Oropharynx clear without lesion.  Tongue normal. Mucous membranes moist.  Two small left ear rings. RESPIRATORY:  Clear to auscultation without rales, wheezes or rhonchi. CARDIOVASCULAR:  Regular rate and rhythm without murmur, rub or gallop. ABDOMEN:  Soft, non-tender, with active bowel sounds, and no appreciable hepatosplenomegaly.  No masses. SKIN:  Multiple tattoos.  No rashes, ulcers or lesions. EXTREMITIES: No edema, no skin discoloration or tenderness.  No palpable cords. LYMPH NODES: No palpable cervical, supraclavicular, axillary or inguinal adenopathy  NEUROLOGICAL: Unremarkable.  Gait slow and metered with a cane. PSYCH:  Appropriate.  No visits with results within 3 Day(s) from this visit.  Latest known visit with results is:  No results found for any previous visit.    Assessment:  Sean Chen is a 46 y.o. male with left lower extremity DVT documented on 04/07/2016.  He has a history of left knee injury 20 years ago (wears a brace off/on) and right foot fracture  18 months ago.  He ambulates with a cane.  He sits for 6-7 hours/day at work.  He smokes 1 pack of cigarettes a day.  He denies any family history of thrombosis.  He is on Eliquis.    Left lower extremity duplex on 04/07/2016 revealed a non-occlusive, acute and sub-acute deep venous thrombosis (DVT) of the left popliteal vein.  He had an occlusive, acute and sub-acute, DVT of the left gastrocnemius, peroneal veins and posterior tibial veins.  He had an obstructive thrombophlebitis of the left small saphenous vein.  Symptomatically, he denies any weight loss.  He denies any symptoms except for chronic issues associated with his left knee and right foot.  He has some mild excess brusing on Eliquis.  Plan: 1.  Discuss diagnosis and management of lower extremity DVT.  Discuss decreased mobility and smoking contributing to thrombosis.  Discuss getting up and walking at work every hour (sits for 6-7 hours/day).  Discuss assessing for other etiologies.  Discuss plan  for minimum of 6 months of anticoagulation.  Discuss hypercoagulable work-up.  Discuss checking D-dimers prior to coming off anticoagulation.  Discuss life long anticoagulation if patient has a lupus anticoagulant.    Discuss sometimes false positives lupus anticoagulant on Xarelto or Eliquis.  If positive, will need to be tested in the future when off anticoagulation.  To have a lupus anticoagulant positive/anti-cardiolipin syndrome, patients need 2 tests + 3 months apart not on blood thinners.    2.  Labs today:  CBC with diff, CMP, Factor V Leiden, prothrombin gene mutation, lupus anticoagulant panel, anti-cardiolipin antibodies, beta-2 glycoprotein antibodies, protein C antigen/activity, protein S antigen/activity, and ATIII antigen/activity.  3.  RTC in 2 weeks for for MD assessment and review of work-up.   Rosey Bath, MD  07/21/2016, 3:09 PM

## 2016-07-21 NOTE — Progress Notes (Signed)
Patient saw Dr. FijiPeru at last visit.  States nothing was done at that visit. Wants to know why he is getting DVT's?

## 2016-07-22 LAB — PROTEIN S, TOTAL: Protein S Ag, Total: 77 % (ref 60–150)

## 2016-07-22 LAB — LUPUS ANTICOAGULANT PANEL
DRVVT: 39.1 s (ref 0.0–47.0)
PTT Lupus Anticoagulant: 32.9 s (ref 0.0–51.9)

## 2016-07-22 LAB — PROTEIN C ACTIVITY: Protein C Activity: 114 % (ref 73–180)

## 2016-07-22 LAB — PROTEIN S ACTIVITY: Protein S Activity: 105 % (ref 63–140)

## 2016-07-22 LAB — CARDIOLIPIN ANTIBODIES, IGG, IGM, IGA
Anticardiolipin IgA: <9 APL U/mL (ref 0–11)
Anticardiolipin IgG: <9 GPL U/mL (ref 0–14)
Anticardiolipin IgM: <9 MPL U/mL (ref 0–12)

## 2016-07-22 LAB — PROTEIN C, TOTAL: Protein C, Total: 96 % (ref 60–150)

## 2016-07-24 LAB — BETA-2-GLYCOPROTEIN I ABS, IGG/M/A
Beta-2 Glyco I IgG: <9 GPI IgG units (ref 0–20)
Beta-2-Glycoprotein I IgA: 16 GPI IgA units (ref 0–25)
Beta-2-Glycoprotein I IgM: <9 GPI IgM units (ref 0–32)

## 2016-07-26 LAB — FACTOR 5 LEIDEN

## 2016-07-27 LAB — PROTHROMBIN GENE MUTATION

## 2016-08-02 ENCOUNTER — Encounter (INDEPENDENT_AMBULATORY_CARE_PROVIDER_SITE_OTHER): Payer: Self-pay | Admitting: Vascular Surgery

## 2016-08-02 ENCOUNTER — Ambulatory Visit (INDEPENDENT_AMBULATORY_CARE_PROVIDER_SITE_OTHER): Payer: Managed Care, Other (non HMO) | Admitting: Vascular Surgery

## 2016-08-02 VITALS — BP 130/83 | HR 92 | Resp 16 | Wt 171.0 lb

## 2016-08-02 DIAGNOSIS — M1710 Unilateral primary osteoarthritis, unspecified knee: Secondary | ICD-10-CM | POA: Insufficient documentation

## 2016-08-02 DIAGNOSIS — M1712 Unilateral primary osteoarthritis, left knee: Secondary | ICD-10-CM | POA: Diagnosis not present

## 2016-08-02 DIAGNOSIS — M7989 Other specified soft tissue disorders: Secondary | ICD-10-CM | POA: Diagnosis not present

## 2016-08-02 DIAGNOSIS — M79605 Pain in left leg: Secondary | ICD-10-CM | POA: Diagnosis not present

## 2016-08-02 DIAGNOSIS — I82432 Acute embolism and thrombosis of left popliteal vein: Secondary | ICD-10-CM | POA: Diagnosis not present

## 2016-08-02 DIAGNOSIS — M179 Osteoarthritis of knee, unspecified: Secondary | ICD-10-CM | POA: Insufficient documentation

## 2016-08-02 NOTE — Progress Notes (Signed)
MRN : [id number]  Sean Chen is a 46 y.o. (1971-04-03) male who presents with chief complaint of  Chief Complaint  Patient presents with  . Follow-up  .  History of Present Illness:  The patient presents to the office for evaluation of DVT.  DVT was identified at [facility] by Duplex ultrasound.  The initial symptoms were pain and swelling in the lower extremity.  The patient notes the leg continues to be very painful with dependency and swells quite a bite.  Symptoms are much better with elevation.  The patient notes minimal edema in the morning which steadily worsens throughout the day.    The patient has not been using compression therapy at this point.  No SOB or pleuritic chest pains.  No cough or hemoptysis.  No blood per rectum or blood in any sputum.  No excessive bruising per the patient.    Current Meds  Medication Sig  . apixaban (ELIQUIS) 5 MG TABS tablet Take by mouth.  . traMADol (ULTRAM) 50 MG tablet Take by mouth every 6 (six) hours as needed.    Past Medical History:  Diagnosis Date  . Peripheral vascular disease [facility])     Past Surgical History:  Procedure Laterality Date  . APPENDECTOMY    . CYST EXCISION  2000  . ORIF ANKLE FRACTURE Right 01/01/2015   Procedure: OPEN REDUCTION INTERNAL FIXATION (ORIF) CALCANEUS;  Surgeon: Gwyneth Revels, DPM;  Location: [facility];  Service: Podiatry;  Laterality: Right;  POPLITEAL    Social History Social History  Substance Use Topics  . Smoking status: Current Every Day Smoker    Packs/day: 1.00    Types: Cigarettes  . Smokeless tobacco: Current User  . Alcohol use 7.2 oz/week    12 Cans of beer per week    Family History Family History  Problem Relation Age of Onset  . Family history unknown: Yes  No family history of bleeding/clotting disorders, porphyria or autoimmune disease   No Known Allergies   REVIEW OF SYSTEMS (Negative unless checked)  Constitutional: [] Weight loss  [] Fever   [] Chills Cardiac: [] Chest pain   [] Chest pressure   [] Palpitations   [] Shortness of breath when laying flat   [] Shortness of breath with exertion. Vascular:  [] Pain in legs with walking   [] Pain in legs at rest  [x] History of DVT   [] Phlebitis   [x] Swelling in legs   [x] Varicose veins   [] Non-healing ulcers Pulmonary:   [] Uses home oxygen   [] Productive cough   [] Hemoptysis   [] Wheeze  [] COPD   [] Asthma Neurologic:  [] Dizziness   [] Seizures   [] History of stroke   [] History of TIA  [] Aphasia   [] Vissual changes   [] Weakness or numbness in arm   [] Weakness or numbness in leg Musculoskeletal:   [] Joint swelling   [] Joint pain   [] Low back pain Hematologic:  [] Easy bruising  [] Easy bleeding   [] Hypercoagulable state   [] Anemic Gastrointestinal:  [] Diarrhea   [] Vomiting  [] Gastroesophageal reflux/heartburn   [] Difficulty swallowing. Genitourinary:  [] Chronic kidney disease   [] Difficult urination  [] Frequent urination   [] Blood in urine Skin:  [] Rashes   [] Ulcers  Psychological:  [] History of anxiety   []  History of major depression.  Physical Examination  Vitals:   08/02/16 1627  BP: 130/83  Pulse: 92  Resp: 16  Weight: 171 lb (77.6 kg)   Body mass index is 25.25 kg/m. Gen: WD/WN, NAD Head: [place]/AT, No temporalis wasting.  Ear/Nose/Throat: Hearing grossly intact, nares w/o  erythema or drainage, poor dentition Eyes: PER, EOMI, sclera nonicteric.  Neck: Supple, no masses.  No bruit or JVD.  Pulmonary:  Good air movement, clear to auscultation bilaterally, no use of accessory muscles.  Cardiac: RRR, normal S1, S2, no Murmurs. Vascular: scattered varicose veins Vessel Right Left  Radial Palpable Palpable  Ulnar Palpable Palpable  Brachial Palpable Palpable  Carotid Palpable Palpable  Femoral Palpable Palpable  Popliteal Palpable Palpable  PT Palpable Palpable  DP Palpable Palpable   Gastrointestinal: soft, non-distended. No guarding/no peritoneal signs.  Musculoskeletal: M/S 5/5  throughout.  No deformity or atrophy.  Neurologic: CN 2-12 intact. Pain and light touch intact in extremities.  Symmetrical.  Speech is fluent. Motor exam as listed above. Psychiatric: Judgment intact, Mood & affect appropriate for pt's clinical situation. Dermatologic: No rashes or ulcers noted.  No changes consistent with cellulitis. Lymph : No Cervical lymphadenopathy, no lichenification or skin changes of chronic lymphedema.  CBC Lab Results  Component Value Date   WBC 7.7 07/21/2016   HGB 15.7 07/21/2016   HCT 46.6 07/21/2016   MCV 95.8 07/21/2016   PLT 213 07/21/2016    BMET    Component Value Date/Time   NA 135 07/21/2016 1458   K 4.1 07/21/2016 1458   CL 104 07/21/2016 1458   CO2 26 07/21/2016 1458   GLUCOSE 94 07/21/2016 1458   BUN 14 07/21/2016 1458   CREATININE 0.77 07/21/2016 1458   CALCIUM 9.0 07/21/2016 1458   GFRNONAA >60 07/21/2016 1458   GFRAA >60 07/21/2016 1458   CrCl cannot be calculated (Patient's most recent lab result is older than the maximum 21 days allowed.).  COAG No results found for: INR, PROTIME  Radiology No results found.  Assessment/Plan 1. Acute deep vein thrombosis (DVT) of popliteal vein of left lower extremity (HCC) Recommend:   No surgery or intervention at this point in time.  IVC filter is not indicated at present.  Patient's duplex ultrasound of the venous system shows DVT from the popliteal to the femoral veins.  The patient is initiated on anticoagulation   Elevation was stressed, use of a recliner was discussed.  I have had a long discussion with the patient regarding DVT and post phlebitic changes such as swelling and why it  causes symptoms such as pain.  The patient will wear graduated compression stockings class 1 (20-30 mmHg), beginning after three full days of anticoagulation, on a daily basis a prescription was given. The patient will  beginning wearing the stockings first thing in the morning and removing them in  the evening. The patient is instructed specifically not to sleep in the stockings.  In addition, behavioral modification including elevation during the day and avoidance of prolonged dependency will be initiated.    The patient will continue anticoagulation for now as there have not been any problems or complications at this point.   - VAS US LOWER EXTREMITY VENOUS (DVT); Future  2. Pain and swelling of left lower extremity See #1  3. Primary osteoarthritis of left knee Continue NSAID medications as already ordered, these medications have been reviewed and there are no changes at this time.      Levora DredgeGregory Presten Joost, MD  08/11/2016 4:22 PM

## 2016-08-04 ENCOUNTER — Inpatient Hospital Stay (HOSPITAL_BASED_OUTPATIENT_CLINIC_OR_DEPARTMENT_OTHER): Payer: Managed Care, Other (non HMO) | Admitting: Hematology and Oncology

## 2016-08-04 VITALS — BP 108/74 | HR 90 | Temp 97.8°F | Resp 18 | Wt 171.7 lb

## 2016-08-04 DIAGNOSIS — I739 Peripheral vascular disease, unspecified: Secondary | ICD-10-CM

## 2016-08-04 DIAGNOSIS — Z79899 Other long term (current) drug therapy: Secondary | ICD-10-CM

## 2016-08-04 DIAGNOSIS — F17210 Nicotine dependence, cigarettes, uncomplicated: Secondary | ICD-10-CM | POA: Diagnosis not present

## 2016-08-04 DIAGNOSIS — I82432 Acute embolism and thrombosis of left popliteal vein: Secondary | ICD-10-CM

## 2016-08-04 DIAGNOSIS — G629 Polyneuropathy, unspecified: Secondary | ICD-10-CM

## 2016-08-04 DIAGNOSIS — G8929 Other chronic pain: Secondary | ICD-10-CM | POA: Diagnosis not present

## 2016-08-04 DIAGNOSIS — Z86718 Personal history of other venous thrombosis and embolism: Secondary | ICD-10-CM | POA: Diagnosis not present

## 2016-08-04 DIAGNOSIS — Z7901 Long term (current) use of anticoagulants: Secondary | ICD-10-CM | POA: Diagnosis not present

## 2016-08-04 NOTE — Progress Notes (Signed)
Patient here today for results.  Patient no longer taking Eliquis.

## 2016-08-04 NOTE — Progress Notes (Signed)
[facility] day:  08/04/2016  Chief Complaint: Sean Chen is a 46 y.o. male with a left lower extremity DVT who is seen for review of work-up and discussion regarding direction of therapy.  HPI:  The patient was last seen in the hematology clinic on 07/21/2016.  At that time, he was seen for initial assessment by Dr. Mike Gip.  He was diagnosed with a left lower extremity DVT on 04/07/2016.  He had a history of left knee injury 20 years ago (wears a brace off/on) and right foot fracture 18 months ago.  He ambulates with a cane.  He sits for 6-7 hours/day at work. He smokes 1 pack of cigarettes a day.  He denied any family history of thrombosis.  He was on Eliquis.    He underwent a work-up.  CBC with diff and CMP were normal. Factor V Leiden was negative.  Prothrombin gene mutation was negative.  Protein S activity was 105%.  Protein S antigen was 77%.  Protein C activity was 114%. Protein C total was 96%.  ATIII activity was 105%.  Lupus anticoagulant panel was negative. Anti-cardiolipin antibodies were negative.  Beta-2 glycoprotein antibodies were negative.  Symptomatically, he is feeling good. Dr.Schnier saw him last week and reviewed his labs and took him off his Eliquis.  He is scheduled for a follow-up ultrasound in 6 months.  He denies any abnormal bleeding. He states he had minor numbness and tingling in both legs which is better since being off Eliquis. He has chronic right foot pain. He takes Tramadol prn which seems to help. He continues to use a cane for stability. Left knee swelling is better.  He continues to smoke 1 pack a day.   Past Medical History:  Diagnosis Date  . Peripheral vascular disease [facility])     Past Surgical History:  Procedure Laterality Date  . APPENDECTOMY    . CYST EXCISION  2000  . ORIF ANKLE FRACTURE Right 01/01/2015   Procedure: OPEN REDUCTION INTERNAL FIXATION (ORIF) CALCANEUS;  Surgeon: Samara Deist, DPM;   Location: [place];  Service: Podiatry;  Laterality: Right;  POPLITEAL    Family History  Problem Relation Age of Onset  . Family history unknown: Yes    Social History:  reports that he has been smoking Cigarettes.  He has been smoking about 1.00 pack per day. He uses smokeless tobacco. He reports that he drinks about 7.2 oz of alcohol per week . He reports that he does not use drugs.  He has smoked 1 pack of cigarettes/day x 30 years.  He drinks 2-3 drinks on 2-3 days/week.  He has no children.  He lives in  [place].  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  The patient is alone today.  Allergies: No Known Allergies  Current Medications: Current Outpatient Prescriptions  Medication Sig Dispense Refill  . traMADol (ULTRAM) 50 MG tablet Take by mouth every 6 (six) hours as needed.    Marland Kitchen apixaban (ELIQUIS) 5 MG TABS tablet Take by mouth.     No current facility-administered medications for this visit.     Review of Systems:  GENERAL:  Feels good.  No fevers, sweats or weight loss. PERFORMANCE STATUS (ECOG):  1 HEENT:  No visual changes, runny nose, sore throat, mouth sores or tenderness. Lungs: No shortness of breath or cough.  No hemoptysis. Cardiac:  No chest pain, palpitations, orthopnea, or PND. GI:  No nausea,  vomiting, diarrhea, constipation, melena or hematochezia. GU:  No urgency, frequency, dysuria, or hematuria. Musculoskeletal:  Right foot pain.  No back pain.  Left knee swelling, improved.  No muscle tenderness. Extremities:  No pain or swelling. Skin:  No rashes or skin changes. Neuro:  No headache, numbness or weakness, balance or coordination issues. Endocrine:  No diabetes, thyroid issues, hot flashes or night sweats. Psych:  No mood changes, depression or anxiety. Pain:  Right foot pain. Review of systems:  All other systems reviewed and found to be negative.  Physical Exam: Blood pressure 108/74, pulse 90, temperature 97.8 F (36.6  C), temperature source Tympanic, resp. rate 18, weight 171 lb 11.8 oz (77.9 kg). GENERAL:  Well developed, well nourished, gentleman sitting comfortably in the exam room in no acute distress.  He has a cane at his side. MENTAL STATUS:  Alert and oriented to person, place and time. HEAD:  Black hair with graying beard.  Normocephalic, atraumatic, face symmetric, no Cushingoid features. EYES:  Green eyes.  No conjunctivitis or scleral icterus. NEUROLOGICAL: Unremarkable.  Gait slow and metered with a cane. PSYCH:  Appropriate.   No visits with results within 3 Day(s) from this visit.  Latest known visit with results is:  Office Visit on 07/21/2016  Component Date Value Ref Range Status  . WBC 07/21/2016 7.7  3.8 - 10.6 K/uL Final  . RBC 07/21/2016 4.86  4.40 - 5.90 MIL/uL Final  . Hemoglobin 07/21/2016 15.7  13.0 - 18.0 g/dL Final  . HCT 07/21/2016 46.6  40.0 - 52.0 % Final  . MCV 07/21/2016 95.8  80.0 - 100.0 fL Final  . MCH 07/21/2016 32.4  26.0 - 34.0 pg Final  . MCHC 07/21/2016 33.8  32.0 - 36.0 g/dL Final  . RDW 07/21/2016 12.9  11.5 - 14.5 % Final  . Platelets 07/21/2016 213  150 - 440 K/uL Final  . Neutrophils Relative % 07/21/2016 65  % Final  . Neutro Abs 07/21/2016 5.0  1.4 - 6.5 K/uL Final  . Lymphocytes Relative 07/21/2016 24  % Final  . Lymphs Abs 07/21/2016 1.8  1.0 - 3.6 K/uL Final  . Monocytes Relative 07/21/2016 8  % Final  . Monocytes Absolute 07/21/2016 0.6  0.2 - 1.0 K/uL Final  . Eosinophils Relative 07/21/2016 2  % Final  . Eosinophils Absolute 07/21/2016 0.2  0 - 0.7 K/uL Final  . Basophils Relative 07/21/2016 1  % Final  . Basophils Absolute 07/21/2016 0.1  0 - 0.1 K/uL Final  . Sodium 07/21/2016 135  135 - 145 mmol/L Final  . Potassium 07/21/2016 4.1  3.5 - 5.1 mmol/L Final  . Chloride 07/21/2016 104  101 - 111 mmol/L Final  . CO2 07/21/2016 26  22 - 32 mmol/L Final  . Glucose, Bld 07/21/2016 94  65 - 99 mg/dL Final  . BUN 07/21/2016 14  6 - 20 mg/dL Final   . Creatinine, Ser 07/21/2016 0.77  0.61 - 1.24 mg/dL Final  . Calcium 07/21/2016 9.0  8.9 - 10.3 mg/dL Final  . Total Protein 07/21/2016 7.3  6.5 - 8.1 g/dL Final  . Albumin 07/21/2016 4.1  3.5 - 5.0 g/dL Final  . AST 07/21/2016 27  15 - 41 U/L Final  . ALT 07/21/2016 25  17 - 63 U/L Final  . Alkaline Phosphatase 07/21/2016 54  38 - 126 U/L Final  . Total Bilirubin 07/21/2016 0.1* 0.3 - 1.2 mg/dL Final  . GFR calc non Af Amer 07/21/2016 >60  >60  mL/min Final  . GFR calc Af Amer 07/21/2016 >60  >60 mL/min Final   Comment: (NOTE) The eGFR has been calculated using the CKD EPI equation. This calculation has not been validated in all clinical situations. eGFR's persistently <60 mL/min signify possible Chronic Kidney Disease.   . Anion gap 07/21/2016 5  5 - 15 Final  . Recommendations-F5LEID: 07/21/2016 Comment   Final   Comment: (NOTE) Result:  Negative (no mutation found) Factor V Leiden is a specific mutation (R506Q) in the factor V gene that is associated with an increased risk of venous thrombosis. Factor V Leiden is more resistant to inactivation by activated protein C.  As a result, factor V persists in the circulation leading to a mild hyper- coagulable state.  The Leiden mutation accounts for 90% - 95% of APC resistance.  Factor V Leiden has been reported in patients with deep vein thrombosis, pulmonary embolus, central retinal vein occlusion, cerebral sinus thrombosis and hepatic vein thrombosis. Other risk factors to be considered in the workup for venous thrombosis include the G20210A mutation in the factor II (prothrombin) gene, protein S and C deficiency, and antithrombin deficiencies. Anticardiolipin antibody and lupus anticoagulant analysis may be appropriate for certain patients, as well as homocysteine levels. Contact your local [facility] for information on how to order additi                          onal testing if desired.   . Comment 07/21/2016 Comment    Corrected   Comment: (NOTE) **Genetic counselors are available for health care providers to**  discuss results at [phone]). Methodology: DNA analysis of the Factor V gene was performed by allele-specific PCR. The diagnostic sensitivity and specificity is >99% for both. Molecular-based testing is highly accurate, but as in any laboratory test, diagnostic errors may occur. All test results must be combined with clinical information for the most accurate interpretation. This test was developed and its performance characteristics determined by [facility]. It has not been cleared or approved by the Food and Drug Administration. References: Voelkerding K (1996).  Clin Lab Med [phone]. Allison Quarry, PhD, [facility] Ruben Reason, PhD, [facility] Jens Som, PhD, [facility] Annetta Maw, M.S., PhD, [facility] Alfredo Bach, PhD, [facility] Norva Riffle, PhD, [facility] Earlean Polka PhD, [facility] Performed At: [place], [place] [id number] Rema Fendt                          D OI:[id number]   . Recommendations-PTGENE: 07/21/2016 Comment   Final   Comment: (NOTE) NEGATIVE No mutation identified. Comment: A point mutation (G20210A) in the factor II (prothrombin) gene is the second most common cause of inherited thrombophilia. The incidence of this mutation in the [place] Caucasian population is about 2% and in the [place] American population it is approximately 0.5%. This mutation is rare in the [place] and Native American population. Being heterozygous for a prothrombin mutation increases the risk for developing venous thrombosis about 2 to 3 times above the general population risk. Being homozygous for the prothrombin gene mutation increases the relative risk for venous thrombosis further, although it is not yet known how much further the risk is increased. In women heterozygous for the prothrombin gene mutation, the use of estrogen containing  oral contraceptives increases the relative risk of venous thrombosis about 16 times and the risk of developing cerebral thrombosis is also significantly  increased. In pregnancy the pr                          othrombin gene mutation increases risk for venous thrombosis and may increase risk for stillbirth, placental abruption, pre-eclampsia and fetal growth restriction. If the patient possesses two or more congenital or acquired thrombophilic risk factors, the risk for thrombosis may rise to more than the sum of the risk ratios for the individual mutations. This assay detects only the prothrombin G20210A mutation and does not measure genetic abnormalities elsewhere in the genome. Other thrombotic risk factors may be pursued through systematic clinical laboratory analysis. These factors include the R506Q (Leiden) mutation in the Factor V gene, plasma homocysteine levels, as well as testing for deficiencies of antithrombin III, protein C and protein S.   . Additional Information 07/21/2016 Comment   Final   Comment: (NOTE) Genetic Counselors are available for health care providers to discuss results at [phone]). Methodology: DNA analysis of the Factor II gene was performed by PCR amplification followed by restriction analysis. The diagnostic sensitivity is >99% for both. All the tests must be combined with clinical information for the most accurate interpretation. Molecular-based testing is highly accurate, but as in any laboratory test, diagnostic errors may occur. This test was developed and its performance characteristics determined by [facility]. It has not been cleared or approved by the Food and Drug Administration. Poort SR, et al. Blood. 1996; 99:8338-2505. Varga EA. Circulation. 2004; 397:Q73-A19. Mervin Hack, et Vanduser; 19:700-703. Allison Quarry, PhD, [facility] Ruben Reason, PhD, [facility] Jens Som, PhD, [facility] Annetta Maw, M.S., PhD, [facility] Alfredo Bach, PhD, [facility] Norva Riffle, PhD, [facility] Earlean Polka,                           PhD, [facility] Performed At: [facility] [address] [place], [place] [id number] Nechama Guard MD DZ:[id number]   . Protein S Activity 07/21/2016 105  63 - 140 % Final   Comment: (NOTE) Protein S activity may be falsely increased (masking an abnormal, low result) in patients receiving direct Xa inhibitor (e.g., rivaroxaban, apixaban, edoxaban) or a direct thrombin inhibitor (e.g., dabigatran) anticoagulant treatment due to assay interference by these drugs. Performed At: [facility] [place], [place] [id number] Lindon Romp MD LG:[id number]   . Protein S Ag, Total 07/21/2016 77  60 - 150 % Final   Comment: (NOTE) This test was developed and its performance characteristics determined by [facility]. It has not been cleared or approved by the Food and Drug Administration. Performed At: [facility] [place], [place] [id number] Lindon Romp MD DJ:[id number]   . Protein C Activity 07/21/2016 114  73 - 180 % Final   Comment: (NOTE) Performed At: [facility] [place], [place] [id number] Lindon Romp MD OI:[id number]   . Protein C, Total 07/21/2016 96  60 - 150 % Final   Comment: (NOTE) Performed At: [facility] [place], [place] [id number] Lindon Romp MD QH:[id number]   . Anticardiolipin IgG 07/21/2016 <9  0 - 14 GPL U/mL Final   Comment: (NOTE)                          Negative:              <  15                          Indeterminate:     15 - 20                          Low-Med Positive: >20 - 80                          High Positive:         >80   . Anticardiolipin IgM 07/21/2016 <9  0 - 12 MPL U/mL Final   Comment: (NOTE)                          Negative:              <13                          Indeterminate:     13 - 20                           Low-Med Positive: >20 - 80                          High Positive:         >80   . Anticardiolipin IgA 07/21/2016 <9  0 - 11 APL U/mL Final   Comment: (NOTE)                          Negative:              <12                          Indeterminate:     12 - 20                          Low-Med Positive: >20 - 80                          High Positive:         >80 Performed At: [facility] [place], [place] [id number] Lindon Romp MD EZ:[id number]   . PTT Lupus Anticoagulant 07/21/2016 32.9  0.0 - 51.9 sec Final  . DRVVT 07/21/2016 39.1  0.0 - 47.0 sec Final  . Lupus Anticoag Interp 07/21/2016 Comment:   Corrected   Comment: (NOTE) No lupus anticoagulant was detected. Performed At: [facility] [place], [place] [id number] Lindon Romp MD LE:[id number]   . Beta-2 Glyco I IgG 07/21/2016 <9  0 - 20 GPI IgG units Final   Comment: (NOTE) The reference interval reflects a 3SD or 99th percentile interval, which is thought to represent a potentially clinically significant result in accordance with the International Consensus Statement on the classification criteria for definitive antiphospholipid syndrome (APS). J Thromb Haem 2006;4:295-306.   . Beta-2-Glycoprotein I IgM 07/21/2016 <9  0 - 32 GPI IgM units Final   Comment: (NOTE) The reference interval reflects a 3SD or 99th percentile interval, which is thought to represent a potentially clinically significant result in accordance with the International Consensus Statement on  the classification criteria for definitive antiphospholipid syndrome (APS). J Thromb Haem 2006;4:295-306. Performed At: [facility] [place], [place] [id number] Lindon Romp MD VC:[id number]   . Beta-2-Glycoprotein I IgA 07/21/2016 16  0 - 25 GPI IgA units Final   Comment: (NOTE) The reference interval reflects a 3SD or 99th percentile interval, which is thought to  represent a potentially clinically significant result in accordance with the International Consensus Statement on the classification criteria for definitive antiphospholipid syndrome (APS). J Thromb Haem 2006;4:295-306.   Marland Kitchen AntiThromb III Func 07/21/2016 105  75 - 120 % Final    Assessment:  Demondre Aguas is a 46 y.o. male with left lower extremity DVT documented on 04/07/2016.  He has a history of left knee injury 20 years ago (wears a brace off/on) and right foot fracture 18 months ago.  He ambulates with a cane.  He sits for 6-7 hours/day at work.  He smokes 1 pack of cigarettes a day.  He denies any family history of thrombosis.  He was on  Eliquis (discontinued on 08/02/2016).    Left lower extremity duplex on 04/07/2016 revealed a non-occlusive, acute and sub-acute deep venous thrombosis (DVT) of the left popliteal vein.  He had an occlusive, acute and sub-acute, DVT of the left gastrocnemius, peroneal veins and posterior tibial veins.  He had an obstructive thrombophlebitis of the left small saphenous vein.  Hypercoagulable work-up on 07/21/2016 revealed the following normal labs:  CBC with diff, Factor V Leiden, prothrombin gene mutation, protein S activity and antigen, protein C activity and antigen, ATIII activity, lupus anticoagulant panel, anti-cardiolipin antibodies, and beta-2 glycoprotein antibodies.  Symptomatically, he denies any new complaints.  He denies any symptoms except for chronic issues associated with his left knee and right foot. He stopped his Eliquis last week.   Plan: 1.  Review hypercoagulable work-up.  No evidence of a hypercoagulable status per blood testing.  Discuss risk factors of immobility and smoking.  Encourage activity and smoking cessation.  Discuss typical anticoagulation of 6 months.  Anticoagulation discontinued by Dr. Delana Meyer.  Discuss plan for lifelong anticoagulation if develops a new clot. 2.  Follow-up as scheduled with Dr. Delana Meyer. 3.  RTC prn.    I saw and evaluated the patient, participating in the key portions of the service and reviewing pertinent diagnostic studies and records.  I reviewed the nurse practitioner's note and agree with the findings and the plan.  The assessment and plan were discussed with the patient.  A few questions were asked by the patient and answered.   Faythe Casa, NP  Lequita Asal, MD  08/04/2016, 4:01 PM

## 2016-08-12 ENCOUNTER — Ambulatory Visit (INDEPENDENT_AMBULATORY_CARE_PROVIDER_SITE_OTHER): Payer: Self-pay | Admitting: Vascular Surgery

## 2016-08-12 ENCOUNTER — Encounter (INDEPENDENT_AMBULATORY_CARE_PROVIDER_SITE_OTHER): Payer: Self-pay

## 2016-08-29 ENCOUNTER — Encounter: Payer: Self-pay | Admitting: Hematology and Oncology

## 2016-10-05 ENCOUNTER — Other Ambulatory Visit: Payer: Self-pay | Admitting: Podiatry

## 2016-10-05 DIAGNOSIS — M19171 Post-traumatic osteoarthritis, right ankle and foot: Secondary | ICD-10-CM

## 2016-10-12 ENCOUNTER — Ambulatory Visit: Payer: Managed Care, Other (non HMO)

## 2016-10-15 ENCOUNTER — Ambulatory Visit
Admission: RE | Admit: 2016-10-15 | Discharge: 2016-10-15 | Disposition: A | Discharge status: Home / Self Care | Payer: Managed Care, Other (non HMO) | Source: Ambulatory Visit | Attending: Podiatry | Admitting: Podiatry

## 2016-10-15 DIAGNOSIS — M85871 Other specified disorders of bone density and structure, right ankle and foot: Secondary | ICD-10-CM | POA: Insufficient documentation

## 2016-10-15 DIAGNOSIS — M19171 Post-traumatic osteoarthritis, right ankle and foot: Secondary | ICD-10-CM

## 2016-10-28 ENCOUNTER — Other Ambulatory Visit: Payer: Self-pay | Admitting: Podiatry

## 2016-11-04 ENCOUNTER — Encounter
Admission: RE | Admit: 2016-11-04 | Discharge: 2016-11-04 | Disposition: A | Discharge status: Home / Self Care | Payer: Managed Care, Other (non HMO) | Source: Ambulatory Visit | Attending: Podiatry | Admitting: Podiatry

## 2016-11-04 DIAGNOSIS — Z01818 Encounter for other preprocedural examination: Secondary | ICD-10-CM | POA: Diagnosis not present

## 2016-11-04 HISTORY — DX: Unspecified osteoarthritis, unspecified site: M19.90

## 2016-11-04 HISTORY — DX: Acute embolism and thrombosis of unspecified femoral vein: I82.419

## 2016-11-04 NOTE — Patient Instructions (Signed)
  Your procedure is scheduled ZO:XWRUEAon:Friday November 12, 2016. Report to Same Day Surgery. To find out your arrival time please call [phone] between 1PM - 3PM on Thursday Nov 11, 2016.  Remember: Instructions that are not followed completely may result in serious medical risk, up to and including death, or upon the discretion of your surgeon and anesthesiologist your surgery may need to be rescheduled.    _x___ 1. Do not eat food or drink liquids after midnight. No gum chewing or hard candies.     _x___ 2. No Alcohol for 24 hours before or after surgery.   ____ 3. Bring all medications with you on the day of surgery if instructed.    __x__ 4. Notify your doctor if there is any change in your medical condition     (cold, fever, infections).    __x___ 5. No smoking 24 hours prior to surgery.     Do not wear jewelry, make-up, hairpins, clips or nail polish.  Do not wear lotions, powders, or perfumes.   Do not shave 48 hours prior to surgery. Men may shave face and neck.  Do not bring valuables to the hospital.    [facility] is not responsible for any belongings or valuables.               Contacts, dentures or bridgework may not be worn into surgery.  Leave your suitcase in the car. After surgery it may be brought to your room.  For patients admitted to the hospital, discharge time is determined by your treatment team.   Patients discharged the day of surgery will not be allowed to drive home.    Please read over the following fact sheets that you were given:   [facility] Preparing for Surgery  __x__ Take these medicines the morning of surgery with A SIP OF WATER: NONE     ____ Fleet Enema (as directed)   _x___ Use CHG Soap as directed on instruction sheet  ____ Use inhalers on the day of surgery and bring to hospital day of surgery  ____ Stop metformin 2 days prior to surgery    ____ Take 1/2 of usual insulin dose the night before surgery and none on the morning of surgery.    ____ Stop Coumadin/Plavix/aspirin on does not apply.  _x___ Stop Anti-inflammatories such as Advil, Aleve, Ibuprofen, Motrin, Naproxen, Naprosyn, Goodies powders or aspirin products. OK to take Tylenol.   ____ Stop supplements until after surgery.    ____ Bring C-Pap to the hospital.

## 2016-11-04 NOTE — Pre-Procedure Instructions (Signed)
Instructed pt in use of the incentive spirometer, pt returned correct demonstration of same.  Written instructions also given to pt.

## 2016-11-11 MED ORDER — CEFAZOLIN SODIUM-DEXTROSE 2-4 GM/100ML-% IV SOLN: 2.0000 g | INTRAVENOUS | Status: DC

## 2016-11-12 ENCOUNTER — Encounter: Payer: Self-pay | Admitting: *Deleted

## 2016-11-12 ENCOUNTER — Ambulatory Visit
Admission: RE | Admit: 2016-11-12 | Discharge: 2016-11-12 | Disposition: A | Discharge status: Home / Self Care | Payer: Managed Care, Other (non HMO) | Source: Ambulatory Visit | Attending: Podiatry | Admitting: Podiatry

## 2016-11-12 ENCOUNTER — Ambulatory Visit: Payer: Managed Care, Other (non HMO) | Admitting: Anesthesiology

## 2016-11-12 ENCOUNTER — Ambulatory Visit: Payer: Managed Care, Other (non HMO)

## 2016-11-12 ENCOUNTER — Encounter
Admission: RE | Disposition: A | Discharge status: Home / Self Care | Payer: Self-pay | Source: Ambulatory Visit | Attending: Podiatry

## 2016-11-12 DIAGNOSIS — Z419 Encounter for procedure for purposes other than remedying health state, unspecified: Secondary | ICD-10-CM

## 2016-11-12 DIAGNOSIS — M19071 Primary osteoarthritis, right ankle and foot: Secondary | ICD-10-CM | POA: Diagnosis present

## 2016-11-12 DIAGNOSIS — I739 Peripheral vascular disease, unspecified: Secondary | ICD-10-CM | POA: Diagnosis not present

## 2016-11-12 DIAGNOSIS — F17200 Nicotine dependence, unspecified, uncomplicated: Secondary | ICD-10-CM | POA: Insufficient documentation

## 2016-11-12 HISTORY — PX: FOOT ARTHRODESIS: SHX1655

## 2016-11-12 SURGERY — FUSION, JOINT, FOOT
Anesthesia: General | Site: Foot | Laterality: Right | Wound class: Clean

## 2016-11-12 MED ORDER — OXYCODONE-ACETAMINOPHEN 5-325 MG PO TABS
ORAL_TABLET | ORAL | Status: AC
  Filled 2016-11-12: qty 1

## 2016-11-12 MED ORDER — FENTANYL CITRATE (PF) 100 MCG/2ML IJ SOLN
INTRAMUSCULAR | Status: AC
  Filled 2016-11-12: qty 2

## 2016-11-12 MED ORDER — HYDROMORPHONE HCL 1 MG/ML IJ SOLN
INTRAMUSCULAR | Status: AC
  Filled 2016-11-12: qty 1

## 2016-11-12 MED ORDER — MIDAZOLAM HCL 2 MG/2ML IJ SOLN
INTRAMUSCULAR | Status: DC | PRN
  Administered 2016-11-12: 2 mg via INTRAVENOUS

## 2016-11-12 MED ORDER — ONDANSETRON HCL 4 MG/2ML IJ SOLN: 4.0000 mg | Freq: Four times a day (QID) | INTRAMUSCULAR | Status: DC | PRN

## 2016-11-12 MED ORDER — LACTATED RINGERS IV SOLN
INTRAVENOUS | Status: DC
  Administered 2016-11-12: 09:00:00 via INTRAVENOUS

## 2016-11-12 MED ORDER — FENTANYL CITRATE (PF) 100 MCG/2ML IJ SOLN
25.0000 ug | INTRAMUSCULAR | Status: DC | PRN
  Administered 2016-11-12 (×2): 25 ug via INTRAVENOUS

## 2016-11-12 MED ORDER — MIDAZOLAM HCL 2 MG/2ML IJ SOLN
1.0000 mg | Freq: Once | INTRAMUSCULAR | Status: AC
  Administered 2016-11-12: 1 mg via INTRAVENOUS

## 2016-11-12 MED ORDER — DEXAMETHASONE SODIUM PHOSPHATE 10 MG/ML IJ SOLN
INTRAMUSCULAR | Status: AC
  Filled 2016-11-12: qty 1

## 2016-11-12 MED ORDER — MIDAZOLAM HCL 2 MG/2ML IJ SOLN
INTRAMUSCULAR | Status: AC
  Filled 2016-11-12: qty 2

## 2016-11-12 MED ORDER — BUPIVACAINE HCL (PF) 0.25 % IJ SOLN
INTRAMUSCULAR | Status: DC | PRN
  Administered 2016-11-12: 13 mL

## 2016-11-12 MED ORDER — LIDOCAINE HCL (CARDIAC) 20 MG/ML IV SOLN
INTRAVENOUS | Status: DC | PRN
  Administered 2016-11-12: 100 mg via INTRAVENOUS

## 2016-11-12 MED ORDER — SEVOFLURANE IN SOLN
RESPIRATORY_TRACT | Status: AC
  Filled 2016-11-12: qty 250

## 2016-11-12 MED ORDER — LIDOCAINE HCL (PF) 1 % IJ SOLN
INTRAMUSCULAR | Status: AC
  Filled 2016-11-12: qty 30

## 2016-11-12 MED ORDER — POVIDONE-IODINE 7.5 % EX SOLN: Freq: Once | CUTANEOUS | Status: DC

## 2016-11-12 MED ORDER — FAMOTIDINE 20 MG PO TABS
ORAL_TABLET | ORAL | Status: AC
  Filled 2016-11-12: qty 1

## 2016-11-12 MED ORDER — KETOROLAC TROMETHAMINE 30 MG/ML IJ SOLN
30.0000 mg | Freq: Once | INTRAMUSCULAR | Status: AC
  Administered 2016-11-12: 30 mg via INTRAVENOUS

## 2016-11-12 MED ORDER — FAMOTIDINE 20 MG PO TABS
20.0000 mg | ORAL_TABLET | Freq: Once | ORAL | Status: AC
  Administered 2016-11-12: 20 mg via ORAL

## 2016-11-12 MED ORDER — FENTANYL CITRATE (PF) 100 MCG/2ML IJ SOLN
INTRAMUSCULAR | Status: DC | PRN
  Administered 2016-11-12 (×4): 50 ug via INTRAVENOUS

## 2016-11-12 MED ORDER — OXYCODONE-ACETAMINOPHEN 5-325 MG PO TABS: 1.0000 | ORAL_TABLET | ORAL | 0 refills | Status: DC | PRN

## 2016-11-12 MED ORDER — ONDANSETRON HCL 4 MG/2ML IJ SOLN: 4.0000 mg | Freq: Once | INTRAMUSCULAR | Status: DC | PRN

## 2016-11-12 MED ORDER — HYDROMORPHONE HCL 1 MG/ML IJ SOLN
0.5000 mg | INTRAMUSCULAR | Status: DC | PRN
  Administered 2016-11-12 (×2): 0.5 mg via INTRAVENOUS

## 2016-11-12 MED ORDER — CEFAZOLIN SODIUM-DEXTROSE 2-3 GM-% IV SOLR
INTRAVENOUS | Status: DC | PRN
  Administered 2016-11-12: 2 g via INTRAVENOUS

## 2016-11-12 MED ORDER — DEXMEDETOMIDINE HCL IN NACL 200 MCG/50ML IV SOLN
INTRAVENOUS | Status: AC
  Filled 2016-11-12: qty 50

## 2016-11-12 MED ORDER — OXYCODONE-ACETAMINOPHEN 5-325 MG PO TABS
1.0000 | ORAL_TABLET | ORAL | Status: DC | PRN
  Administered 2016-11-12: 1 via ORAL

## 2016-11-12 MED ORDER — LIDOCAINE HCL (PF) 1 % IJ SOLN
INTRAMUSCULAR | Status: AC
  Filled 2016-11-12: qty 5

## 2016-11-12 MED ORDER — PROPOFOL 10 MG/ML IV BOLUS
INTRAVENOUS | Status: DC | PRN
  Administered 2016-11-12: 200 mg via INTRAVENOUS

## 2016-11-12 MED ORDER — ONDANSETRON HCL 4 MG/2ML IJ SOLN
INTRAMUSCULAR | Status: DC | PRN
  Administered 2016-11-12: 4 mg via INTRAVENOUS

## 2016-11-12 MED ORDER — DEXAMETHASONE SODIUM PHOSPHATE 10 MG/ML IJ SOLN
INTRAMUSCULAR | Status: DC | PRN
  Administered 2016-11-12: 5 mg via INTRAVENOUS

## 2016-11-12 MED ORDER — MIDAZOLAM HCL 2 MG/2ML IJ SOLN
2.0000 mg | Freq: Once | INTRAMUSCULAR | Status: AC
  Administered 2016-11-12: 2 mg via INTRAVENOUS

## 2016-11-12 MED ORDER — CEFAZOLIN SODIUM-DEXTROSE 2-4 GM/100ML-% IV SOLN
INTRAVENOUS | Status: AC
  Filled 2016-11-12: qty 100

## 2016-11-12 MED ORDER — BUPIVACAINE HCL (PF) 0.25 % IJ SOLN
INTRAMUSCULAR | Status: AC
  Filled 2016-11-12: qty 30

## 2016-11-12 MED ORDER — ONDANSETRON HCL 4 MG PO TABS: 4.0000 mg | ORAL_TABLET | Freq: Four times a day (QID) | ORAL | Status: DC | PRN

## 2016-11-12 MED ORDER — DEXMEDETOMIDINE HCL 200 MCG/2ML IV SOLN
INTRAVENOUS | Status: DC | PRN
  Administered 2016-11-12: 12 ug via INTRAVENOUS

## 2016-11-12 MED ORDER — KETOROLAC TROMETHAMINE 30 MG/ML IJ SOLN
INTRAMUSCULAR | Status: AC
  Filled 2016-11-12: qty 1

## 2016-11-12 MED ORDER — ROPIVACAINE HCL 5 MG/ML IJ SOLN
INTRAMUSCULAR | Status: AC
  Filled 2016-11-12: qty 30

## 2016-11-12 SURGICAL SUPPLY — 60 items
BANDAGE ELASTIC 4 LF NS (GAUZE/BANDAGES/DRESSINGS) ×4 IMPLANT
BANDAGE STRETCH 3X4.1 STRL (GAUZE/BANDAGES/DRESSINGS) ×2 IMPLANT
BIT DRILL CANNULATED 4.6 (BIT) ×2 IMPLANT
BLADE SURG 15 STRL LF DISP TIS (BLADE) ×2 IMPLANT
BLADE SURG 15 STRL SS (BLADE) ×2
BNDG COHESIVE 4X5 TAN STRL (GAUZE/BANDAGES/DRESSINGS) ×4 IMPLANT
BNDG ESMARK 4X12 TAN STRL LF (GAUZE/BANDAGES/DRESSINGS) ×2 IMPLANT
BNDG GAUZE 4.5X4.1 6PLY STRL (MISCELLANEOUS) ×2 IMPLANT
BUR 4X45 EGG (BURR) ×2 IMPLANT
CANISTER SUCT 1200ML W/VALVE (MISCELLANEOUS) ×2 IMPLANT
COUNTERSINK 7.0 (MISCELLANEOUS) ×2
COVER PIN YLW 0.028-062 (MISCELLANEOUS) ×4 IMPLANT
CUFF TOURN 18 STER (MISCELLANEOUS) ×2 IMPLANT
CUFF TOURN SGL QUICK 24 (TOURNIQUET CUFF) ×1
CUFF TRNQT CYL 24X4X40X1 (TOURNIQUET CUFF) ×1 IMPLANT
DRAPE C-ARM XRAY 36X54 (DRAPES) ×2 IMPLANT
DRAPE C-ARMOR (DRAPES) ×2 IMPLANT
DURAPREP 26ML APPLICATOR (WOUND CARE) ×2 IMPLANT
ELECT REM PT RETURN 9FT ADLT (ELECTROSURGICAL) ×2
ELECTRODE REM PT RTRN 9FT ADLT (ELECTROSURGICAL) ×1 IMPLANT
GAUZE PETRO XEROFOAM 1X8 (MISCELLANEOUS) ×2 IMPLANT
GAUZE SPONGE 4X4 12PLY STRL (GAUZE/BANDAGES/DRESSINGS) ×2 IMPLANT
GAUZE STRETCH 2X75IN STRL (MISCELLANEOUS) ×2 IMPLANT
GLOVE BIO SURGEON STRL SZ7.5 (GLOVE) ×2 IMPLANT
GLOVE INDICATOR 8.0 STRL GRN (GLOVE) ×2 IMPLANT
GOWN STRL REUS W/ TWL LRG LVL3 (GOWN DISPOSABLE) ×2 IMPLANT
GOWN STRL REUS W/TWL LRG LVL3 (GOWN DISPOSABLE) ×2
GRADUATE 1200CC STRL 31836 (MISCELLANEOUS) ×2 IMPLANT
GRAFT TRIN ELITE MED MUSC TRAN (Graft) ×2 IMPLANT
K-WIRE SINGLE TROCAR 2.3X230 (WIRE) ×4
K-WIRE SMOOTH 1.6X150MM (WIRE) ×4
KIT RM TURNOVER STRD PROC AR (KITS) ×2 IMPLANT
KWIRE SINGLE TROCAR 2.3X230 (WIRE) ×2 IMPLANT
KWIRE SMOOTH 1.6X150MM (WIRE) ×2 IMPLANT
NEEDLE FILTER BLUNT 18X 1/2SAF (NEEDLE) ×1
NEEDLE FILTER BLUNT 18X1 1/2 (NEEDLE) ×1 IMPLANT
NEEDLE HYPO 25X1 1.5 SAFETY (NEEDLE) ×4 IMPLANT
NS IRRIG 1000ML POUR BTL (IV SOLUTION) ×2 IMPLANT
PACK EXTREMITY ARMC (MISCELLANEOUS) ×2 IMPLANT
PADDING CAST BLEND 4X4 NS (MISCELLANEOUS) ×2 IMPLANT
SCREW CANN SHORT THREAD 7.0X78 (Screw) ×2 IMPLANT
SCREW CANN SHORT THREAD 7.0X84 (Screw) ×2 IMPLANT
SCREW COUNTERSINK 7.0 (MISCELLANEOUS) ×1 IMPLANT
SPLINT CAST 1 STEP 4X30 (MISCELLANEOUS) ×2 IMPLANT
SPLINT FAST PLASTER 5X30 (CAST SUPPLIES) ×1
SPLINT PLASTER CAST FAST 5X30 (CAST SUPPLIES) ×1 IMPLANT
STOCKINETTE M/LG 89821 (MISCELLANEOUS) ×2 IMPLANT
STRIP CLOSURE SKIN 1/4X4 (GAUZE/BANDAGES/DRESSINGS) ×2 IMPLANT
SUT ETHILON 3-0 FS-10 30 BLK (SUTURE) ×2
SUT ETHILON 4-0 (SUTURE) ×1
SUT ETHILON 4-0 FS2 18XMFL BLK (SUTURE) ×1
SUT VIC AB 3-0 SH 27 (SUTURE) ×1
SUT VIC AB 3-0 SH 27X BRD (SUTURE) ×1 IMPLANT
SUT VIC AB 4-0 FS2 27 (SUTURE) IMPLANT
SUTURE EHLN 3-0 FS-10 30 BLK (SUTURE) ×1 IMPLANT
SUTURE ETHLN 4-0 FS2 18XMF BLK (SUTURE) ×1 IMPLANT
SYRINGE 10CC LL (SYRINGE) ×2 IMPLANT
TOWEL OR 17X26 4PK STRL BLUE (TOWEL DISPOSABLE) ×2 IMPLANT
WASHER DOME 7.0MM (Miscellaneous) ×2 IMPLANT
WIRE Z .062 C-WIRE SPADE TIP (WIRE) ×6 IMPLANT

## 2016-11-12 NOTE — Anesthesia Post-op Follow-up Note (Cosign Needed)
Anesthesia QCDR form completed.        

## 2016-11-12 NOTE — Discharge Instructions (Signed)
[facility]  POST OPERATIVE INSTRUCTIONS FOR DR. TROXLER AND DR[facility]   1. Take your medication as prescribed.  Pain medication should be taken only as needed.  2. Keep the dressing clean, dry and intact.  3. Keep your foot elevated above the heart level for the first 48 hours.  4. Walking to the bathroom and brief periods of walking are acceptable, unless we have instructed you to be non-weight bearing.  5. Always wear your post-op shoe when walking.  Always use your crutches if you are to be non-weight bearing.  6. Do not take a shower. Baths are permissible as long as the foot is kept out of the water.   7. Every hour you are awake:  - Bend your knee 15 times.  8. Call [facility] [phone]) if any of the following problems occur: - You develop a temperature or fever. - The bandage becomes saturated with blood. - Medication does not stop your pain. - Injury of the foot occurs. - Any symptoms of infection including redness, odor, or red streaks running from wound.   AMBULATORY SURGERY  DISCHARGE INSTRUCTIONS   1) The drugs that you were given will stay in your system until tomorrow so for the next 24 hours you should not:  A) Drive an automobile B) Make any legal decisions C) Drink any alcoholic beverage   2) You may resume regular meals tomorrow.  Today it is better to start with liquids and gradually work up to solid foods.  You may eat anything you prefer, but it is better to start with liquids, then soup and crackers, and gradually work up to solid foods.   3) Please notify your doctor immediately if you have any unusual bleeding, trouble breathing, redness and pain at the surgery site, drainage, fever, or pain not relieved by medication.    4) Additional Instructions: YOU ARE NON WEIGHT BEARING ON RIGHT FOOT UNTIL INSTRUCTED OTHERWISE.        TAKE A STOOL SOFTENER TWICE A DAY WHILE TAKING NARCOTIC PAIN MEDICINE TO PREVENT CONSTIPATION   Please contact your physician with any problems or Same Day Surgery at [phone], Monday through Friday 6 am to 4 pm, or [place] [facility] Main number at [phone].

## 2016-11-12 NOTE — OR Nursing (Signed)
Contacted Dr Ether GriffinsFowler office spoke to nurse Arline Aspindy  Regarding patient eloquis per Dr Ether GriffinsFowler patient to restart evening dose tonight.

## 2016-11-12 NOTE — Transfer of Care (Signed)
Immediate Anesthesia Transfer of Care Note  Patient: Sean LeechVictor Kichline  Procedure(s) Performed: Procedure(s): ARTHRODESIS FOOT-SUBTALOR JOINT FUSION (Right)  Patient Location: PACU  Anesthesia Type:General  Level of Consciousness: sedated  Airway & Oxygen Therapy: Patient Spontanous Breathing and Patient connected to face mask oxygen  Post-op Assessment: Report given to RN and Post -op Vital signs reviewed and stable  Post vital signs: Reviewed  Last Vitals:  Vitals:   11/12/16 0854 11/12/16 1141  BP: 121/80 102/71  Pulse: 91 77  Resp: 20 12  Temp:      Last Pain:  Vitals:   11/12/16 0716  TempSrc: Oral  PainSc: 4          Complications: No apparent anesthesia complications

## 2016-11-12 NOTE — H&P (Signed)
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  The various methods of treatment have been discussed with the patient.  No guarantees were given.  After consideration of risks, benefits and other options for treatment, the patient has consented to surgery.  I have reviewed the patients' chart and labs.    Patient Vitals for the past 24 hrs:  BP Temp Temp src Pulse Resp SpO2 Height Weight  11/12/16 0829 126/84 - - 76 16 98 % - -  11/12/16 0824 125/83 - - 76 19 99 % - -  11/12/16 0819 (!) 139/98 - - 78 20 100 % - -  11/12/16 0814 (!) 130/92 - - 72 13 99 % - -  11/12/16 0809 (!) 135/96 - - 72 15 98 % - -  11/12/16 0804 (!) 135/98 - - 79 15 98 % - -  11/12/16 0759 (!) 140/104 - - 77 16 99 % - -  11/12/16 0716 (!) 144/99 98.1 F (36.7 C) Oral 82 18 96 % 5\' 9"  (1.753 m) 81.6 kg (180 lb)    A history and physical examination was performed in my office.  The patient was reexamined.  There have been no changes to this history and physical examination.  Gwyneth RevelsFowler, Keylie Beavers A

## 2016-11-12 NOTE — Anesthesia Preprocedure Evaluation (Signed)
Anesthesia Evaluation  Patient identified by MRN, date of birth, ID band Patient awake    Reviewed: Allergy & Precautions, H&P , NPO status , Patient's Chart, lab work & pertinent test results, reviewed documented beta blocker date and time   Airway Mallampati: II  TM Distance: >3 FB Neck ROM: full    Dental  (+) Teeth Intact   Pulmonary neg pulmonary ROS, Current Smoker,    Pulmonary exam normal        Cardiovascular Exercise Tolerance: Good + Peripheral Vascular Disease  negative cardio ROS Normal cardiovascular exam Rate:Normal     Neuro/Psych negative neurological ROS  negative psych ROS   GI/Hepatic negative GI ROS, Neg liver ROS,   Endo/Other  negative endocrine ROS  Renal/GU negative Renal ROS  negative genitourinary   Musculoskeletal   Abdominal   Peds  Hematology negative hematology ROS (+)   Anesthesia Other Findings   Reproductive/Obstetrics negative OB ROS                             Anesthesia Physical Anesthesia Plan  ASA: II  Anesthesia Plan: General LMA   Post-op Pain Management:  Regional for Post-op pain   Induction:   Airway Management Planned:   Additional Equipment:   Intra-op Plan:   Post-operative Plan:   Informed Consent: I have reviewed the patients History and Physical, chart, labs and discussed the procedure including the risks, benefits and alternatives for the proposed anesthesia with the patient or authorized representative who has indicated his/her understanding and acceptance.     Plan Discussed with: CRNA  Anesthesia Plan Comments:         Anesthesia Quick Evaluation

## 2016-11-12 NOTE — Anesthesia Procedure Notes (Signed)
Procedure Name: LMA Insertion Performed by: Almeta MonasFLETCHER, Rahma Meller Pre-anesthesia Checklist: Patient identified, Patient being monitored, Timeout performed, Emergency Drugs available and Suction available Patient Re-evaluated:Patient Re-evaluated prior to inductionOxygen Delivery Method: Circle system utilized Preoxygenation: Pre-oxygenation with 100% oxygen Intubation Type: IV induction Ventilation: Mask ventilation without difficulty LMA: LMA inserted LMA Size: 4.5 Tube type: Oral Number of attempts: 1 Placement Confirmation: positive ETCO2 and breath sounds checked- equal and bilateral Tube secured with: Tape Dental Injury: Teeth and Oropharynx as per pre-operative assessment

## 2016-11-12 NOTE — Anesthesia Procedure Notes (Addendum)
Anesthesia Regional Block: Popliteal block   Pre-Anesthetic Checklist: ,, timeout performed, Correct Patient, Correct Site, Correct Laterality, Correct Procedure, Correct Position, site marked, Risks and benefits discussed,  Surgical consent,  Pre-op evaluation,  At surgeon's request and post-op pain management  Laterality: Right  Prep: chloraprep       Needles:   Needle Type: Echogenic Stimulator Needle     Needle Length: 9cm  Needle Gauge: 20     Additional Needles:   Procedures: ultrasound guided,,,,,,,,  Narrative:   Performed by: Personally   Additional Notes: Time out performed with Functioning IV  confirmed and monitors applied.  An echogenic needle was used. Sterile prep,hand hygiene and sterile gloves were used. Minimal sedation used for procedure.   No paresthesia endorsed by patient during the procedure.  Negative aspiration and negative test dose prior to incremental administration of local anesthetic. The patient tolerated the procedure well with no immediate complications.

## 2016-11-12 NOTE — Op Note (Signed)
Operative note   Surgeon:Leotha Westermeyer    Assistant:none    Preop diagnosis:Post-traumatic subtalar joint arthritis right foot    Postop diagnosis: Same    Procedure: Subtalar joint fusion right foot    EBL: 5 ML's    Anesthesia:regional and general    Hemostasis: Thigh tourniquet inflated to 250 mmHg for 120 minutes    Specimen: None    Complications: None    Operative indications:Sean Chen is an 46 y.o. that presents today for surgical intervention.  The risks/benefits/alternatives/complications have been discussed and consent has been given.    Procedure:  Patient was brought into the OR and placed on the operating table in thesupine position. After anesthesia was obtained theright lower extremity was prepped and draped in usual sterile fashion.  After inflation of the tourniquet and sterile prep and drape attention was directed to the lateral aspect of the foot at the subtalar joint. Previous scar was noted and an incision was made overlying the subtalar joint and lateral calcaneus. Sharp and blunt dissection carried down to the marketed scar tissue to the subtalar joint. The subtalar joint was entered. Marketed amount of scarring was noted within the subtalar joint as well. I was able to free just a small portion of the lateral aspect of the calcaneus to expose the previous placed lateral calcaneal plate from the upper reduction with internal fixation that had occurred with the previous calcaneal fracture. At this point there was noted to be marketed destruction of the posterior cartilage more notably on the calcaneal side and the talar side. At this point with a combination of curettes osteotomes and bur I was able to remove all of the articular cartilage from both the talar and calcaneal side of the posterior and middle and anterior subtalar joint. The wound was flushed with copious amounts or irrigation. It was next drilled with a 2.0 mm drill for fenestration. The bone graft  was left intact. 5 ML's of Trinity bone graft was then packed into the subtalar joint. 2 screws in the central aspect of the plate on the lateral aspect of the calcaneus were removed for ease of placement of the compression screws. Attention was then directed to the posterior aspect of the calcaneus where a small stab incision was made. A drill guide for a 7.0 mm compression screw was placed from the calcaneus to the posterior aspect of the subtalar joint into the talus. Good alignment was noted in the lateral and axial views. Using standard technique with drilling and countersinking and placement of the screw was able to get good compression across the subtalar joint with a 7.0 mm screw from the [employer] 28 screw set. A second screw was placed just lateral crossing the subtalar joint as well with good compression noted with a small washer placed on the screw for excellent compression. The subtalar joint was noted be markedly compressed. The lateral aspect of the incision was flushed. Closure was performed with a 20 and 3-0 Vicryl and a 4-0 Monocryl for skin. 0.25% Marcaine was placed around the ankle joint and an ankle block fashion. A bulky sterile dressing was applied. He was then placed in a posterior splint.    Patient tolerated the procedure and anesthesia well.  Was transported from the OR to the PACU with all vital signs stable and vascular status intact. To be discharged per routine protocol.  Will follow up in approximately 1 week in the outpatient clinic.

## 2016-11-15 ENCOUNTER — Encounter: Payer: Self-pay | Admitting: Podiatry

## 2016-11-15 NOTE — Anesthesia Postprocedure Evaluation (Signed)
Anesthesia Post Note  Patient: Sean Chen  Procedure(s) Performed: Procedure(s) (LRB): ARTHRODESIS FOOT-SUBTALOR JOINT FUSION (Right)  Patient location during evaluation: PACU Anesthesia Type: General Level of consciousness: awake and alert Pain management: pain level controlled Vital Signs Assessment: post-procedure vital signs reviewed and stable Respiratory status: spontaneous breathing, nonlabored ventilation, respiratory function stable and patient connected to nasal cannula oxygen Cardiovascular status: blood pressure returned to baseline and stable Postop Assessment: no signs of nausea or vomiting Anesthetic complications: no     Last Vitals:  Vitals:   11/12/16 1400 11/12/16 1433  BP: 125/80 130/64  Pulse: 86 89  Resp:  18  Temp:      Last Pain:  Vitals:   11/15/16 0922  TempSrc:   PainSc: 0-No pain                 Yevette EdwardsJames G Dossie Swor

## 2017-01-31 ENCOUNTER — Encounter (INDEPENDENT_AMBULATORY_CARE_PROVIDER_SITE_OTHER): Payer: Self-pay | Admitting: Vascular Surgery

## 2017-01-31 ENCOUNTER — Ambulatory Visit (INDEPENDENT_AMBULATORY_CARE_PROVIDER_SITE_OTHER): Payer: Managed Care, Other (non HMO) | Admitting: Vascular Surgery

## 2017-01-31 ENCOUNTER — Other Ambulatory Visit (INDEPENDENT_AMBULATORY_CARE_PROVIDER_SITE_OTHER): Payer: Managed Care, Other (non HMO)

## 2017-01-31 VITALS — BP 139/93 | HR 88 | Resp 17 | Wt 170.0 lb

## 2017-01-31 DIAGNOSIS — I82432 Acute embolism and thrombosis of left popliteal vein: Secondary | ICD-10-CM | POA: Diagnosis not present

## 2017-01-31 DIAGNOSIS — M79605 Pain in left leg: Secondary | ICD-10-CM

## 2017-01-31 DIAGNOSIS — M7989 Other specified soft tissue disorders: Secondary | ICD-10-CM | POA: Diagnosis not present

## 2017-01-31 DIAGNOSIS — M1712 Unilateral primary osteoarthritis, left knee: Secondary | ICD-10-CM

## 2017-02-01 NOTE — Progress Notes (Signed)
MRN : [id number]  Sean Chen is a 46 y.o. (09-05-1970) male who presents with chief complaint of  Chief Complaint  Patient presents with  . ultrasound follow up  .  History of Present Illness:  The patient presents to the office for evaluation of DVT.  DVT was identified at [facility] by Duplex ultrasound.  The initial symptoms were pain and swelling in the lower extremity.  The patient notes the leg continues to be very painful especially the left knee, with dependency and swells quite a bite.  Symptoms are much better with elevation.  The patient notes minimal edema in the morning which steadily worsens throughout the day.  He also describes stocking like dysthesia of the left leg from the knee down.  The patient has not been using compression therapy at this point.  No SOB or pleuritic chest pains.  No cough or hemoptysis.  No blood per rectum or blood in any sputum.  No excessive bruising per the patient.       No outpatient prescriptions have been marked as taking for the 01/31/17 encounter (Office Visit) with Gilda Crease, Latina Craver, MD.    Past Medical History:  Diagnosis Date  . Arthritis    right foot  . 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 Peripheral vascular disease [facility])     Past Surgical History:  Procedure Laterality Date  . APPENDECTOMY    . CYST EXCISION  2000   buttocks  . FOOT ARTHRODESIS Right 11/12/2016   Procedure: ARTHRODESIS FOOT-SUBTALOR JOINT FUSION;  Surgeon: Gwyneth Revels, DPM;  Location: [facility] ORS;  Service: Podiatry;  Laterality: Right;  . ORIF ANKLE FRACTURE Right 01/01/2015   Procedure: OPEN REDUCTION INTERNAL FIXATION (ORIF) CALCANEUS;  Surgeon: Gwyneth Revels, DPM;  Location: [facility];  Service: Podiatry;  Laterality: Right;  POPLITEAL  . WISDOM TOOTH EXTRACTION  1999   4 teeth    Social History Social History  Substance Use Topics  . Smoking status: Current Every Day Smoker    Packs/day: 1.00    Types:  Cigarettes  . Smokeless tobacco: Former Neurosurgeon  . Alcohol use 7.2 oz/week    12 Cans of beer per week    Family History Family History  Problem Relation Age of Onset  . Family history unknown: Yes    No Known Allergies   REVIEW OF SYSTEMS (Negative unless checked)  Constitutional: [] Weight loss  [] Fever  [] Chills Cardiac: [] Chest pain   [] Chest pressure   [] Palpitations   [] Shortness of breath when laying flat   [] Shortness of breath with exertion. Vascular:  [x] Pain in legs with walking   [] Pain in legs at rest  [x] History of DVT   [] Phlebitis   [x] Swelling in legs   [] Varicose veins   [] Non-healing ulcers Pulmonary:   [] Uses home oxygen   [] Productive cough   [] Hemoptysis   [] Wheeze  [] COPD   [] Asthma Neurologic:  [] Dizziness   [] Seizures   [] History of stroke   [] History of TIA  [] Aphasia   [] Vissual changes   [] Weakness or numbness in arm   [] Weakness or numbness in leg Musculoskeletal:   [x] Joint swelling   [x] Joint pain   [] Low back pain Hematologic:  [] Easy bruising  [] Easy bleeding   [] Hypercoagulable state   [] Anemic Gastrointestinal:  [] Diarrhea   [] Vomiting  [] Gastroesophageal reflux/heartburn   [] Difficulty swallowing. Genitourinary:  [] Chronic kidney disease   [] Difficult urination  [] Frequent urination   [] Blood in urine Skin:  [] Rashes   []   Ulcers  Psychological:  [] History of anxiety   []  History of major depression.  Physical Examination  Vitals:   01/31/17 1355  BP: (!) 139/93  Pulse: 88  Resp: 17  Weight: 170 lb (77.1 kg)   Body mass index is 25.1 kg/m. Gen: WD/WN, NAD Head: [place]/AT, No temporalis wasting.  Ear/Nose/Throat: Hearing grossly intact, nares w/o erythema or drainage Eyes: PER, EOMI, sclera nonicteric.  Neck: Supple, no large masses.   Pulmonary:  Good air movement, no audible wheezing bilaterally, no use of accessory muscles.  Cardiac: RRR, no JVD Vascular:   Mild venous stasis changes to the legs bilaterally.  1+ soft pitting edema Vessel  Right Left  Radial Palpable Palpable  PT Palpable Palpable  DP Palpable Palpable  Gastrointestinal: Non-distended. No guarding/no peritoneal signs.  Musculoskeletal: M/S 5/5 throughout.  No deformity or atrophy.  Neurologic: CN 2-12 intact. Symmetrical.  Speech is fluent. Motor exam as listed above. Psychiatric: Judgment intact, Mood & affect appropriate for pt's clinical situation. Dermatologic: mild venous  rashes no ulcers noted.  No changes consistent with cellulitis. Lymph : No lichenification or skin changes of chronic lymphedema.  CBC Lab Results  Component Value Date   WBC 7.7 07/21/2016   HGB 15.7 07/21/2016   HCT 46.6 07/21/2016   MCV 95.8 07/21/2016   PLT 213 07/21/2016    BMET    Component Value Date/Time   NA 135 07/21/2016 1458   K 4.1 07/21/2016 1458   CL 104 07/21/2016 1458   CO2 26 07/21/2016 1458   GLUCOSE 94 07/21/2016 1458   BUN 14 07/21/2016 1458   CREATININE 0.77 07/21/2016 1458   CALCIUM 9.0 07/21/2016 1458   GFRNONAA >60 07/21/2016 1458   GFRAA >60 07/21/2016 1458   CrCl cannot be calculated (Patient's most recent lab result is older than the maximum 21 days allowed.).  COAG No results found for: INR, PROTIME  Radiology No results found.   Assessment/Plan 1. Acute deep vein thrombosis (DVT) of popliteal vein of left lower extremity (HCC) Recommend:   No surgery or intervention at this point in time.  IVC filter is not indicated at present.  Patient's duplex ultrasound of the venous system shows resolution of the DVT from the popliteal vein.  The patient is initiated off anticoagulation   Elevation was stressed, use of a recliner was discussed.  I have had a long discussion with the patient regarding DVT and post phlebitic changes such as swelling and why it  causes symptoms such as pain.  The patient will wear graduated compression stockings, beginning after three full days of anticoagulation, on a daily basis a prescription was given.  The patient will  beginning wearing the stockings first thing in the morning and removing them in the evening. The patient is instructed specifically not to sleep in the stockings.  In addition, behavioral modification including elevation during the day and avoidance of prolonged dependency will be initiated.    The patient will continue anticoagulation for now as there have not been any problems or complications at this point.    2. Primary osteoarthritis of left knee I will have Dr Ernest Pine evaluate  3. Pain and swelling of left lower extremity: Recommend:  I do not find evidence of Vascular pathology that would explain the patient's symptoms  The patient has atypical pain symptoms for vascular disease  Noninvasive studies including venous ultrasound of the legs do not identify severe vascular problems  The patient should continue walking and begin a more  formal exercise program. The patient should continue his antiplatelet therapy and aggressive treatment of the lipid abnormalities. The patient should begin wearing graduated compression socks 15-20 mmHg strength to control her mild edema.  Patient will follow-up with me on a PRN basis  Further work-up of his left lower extremity knee pain is deferred to the Ortho service       Levora Dredge, MD  02/01/2017 8:26 AM

## 2017-04-10 ENCOUNTER — Emergency Department
Admission: EM | Admit: 2017-04-10 | Discharge: 2017-04-10 | Disposition: A | Discharge status: Home / Self Care | Payer: Managed Care, Other (non HMO) | Attending: Emergency Medicine | Admitting: Emergency Medicine

## 2017-04-10 ENCOUNTER — Emergency Department: Payer: Managed Care, Other (non HMO)

## 2017-04-10 ENCOUNTER — Ambulatory Visit (INDEPENDENT_AMBULATORY_CARE_PROVIDER_SITE_OTHER)
Admission: EM | Admit: 2017-04-10 | Discharge: 2017-04-10 | Disposition: A | Discharge status: Other Acute Inpatient Hospital | Payer: Managed Care, Other (non HMO) | Source: Home / Self Care | Attending: Family Medicine | Admitting: Family Medicine

## 2017-04-10 ENCOUNTER — Encounter: Payer: Self-pay | Admitting: Emergency Medicine

## 2017-04-10 DIAGNOSIS — G8929 Other chronic pain: Secondary | ICD-10-CM | POA: Insufficient documentation

## 2017-04-10 DIAGNOSIS — Z86718 Personal history of other venous thrombosis and embolism: Secondary | ICD-10-CM | POA: Insufficient documentation

## 2017-04-10 DIAGNOSIS — Z7901 Long term (current) use of anticoagulants: Secondary | ICD-10-CM | POA: Diagnosis not present

## 2017-04-10 DIAGNOSIS — F17210 Nicotine dependence, cigarettes, uncomplicated: Secondary | ICD-10-CM

## 2017-04-10 DIAGNOSIS — M25512 Pain in left shoulder: Secondary | ICD-10-CM

## 2017-04-10 DIAGNOSIS — I739 Peripheral vascular disease, unspecified: Secondary | ICD-10-CM | POA: Insufficient documentation

## 2017-04-10 DIAGNOSIS — R0602 Shortness of breath: Secondary | ICD-10-CM

## 2017-04-10 DIAGNOSIS — R0789 Other chest pain: Secondary | ICD-10-CM | POA: Diagnosis not present

## 2017-04-10 DIAGNOSIS — R079 Chest pain, unspecified: Secondary | ICD-10-CM

## 2017-04-10 LAB — CBC
HEMATOCRIT: 45.8 % (ref 40.0–52.0)
HEMOGLOBIN: 15.4 g/dL (ref 13.0–18.0)
MCH: 32.6 pg (ref 26.0–34.0)
MCHC: 33.5 g/dL (ref 32.0–36.0)
MCV: 97.1 fL (ref 80.0–100.0)
Platelets: 196 10*3/uL (ref 150–440)
RBC: 4.72 MIL/uL (ref 4.40–5.90)
RDW: 13 % (ref 11.5–14.5)
WBC: 6.8 10*3/uL (ref 3.8–10.6)

## 2017-04-10 LAB — BASIC METABOLIC PANEL
ANION GAP: 9 (ref 5–15)
BUN: 13 mg/dL (ref 6–20)
CO2: 26 mmol/L (ref 22–32)
Calcium: 9 mg/dL (ref 8.9–10.3)
Chloride: 102 mmol/L (ref 101–111)
Creatinine, Ser: 0.89 mg/dL (ref 0.61–1.24)
GFR calc Af Amer: >60 mL/min (ref >60)
GLUCOSE: 112 mg/dL — AB (ref 65–99)
POTASSIUM: 4.6 mmol/L (ref 3.5–5.1)
Sodium: 137 mmol/L (ref 135–145)

## 2017-04-10 LAB — TROPONIN I: Troponin I: <0.03 ng/mL (ref <0.03)

## 2017-04-10 MED ORDER — ASPIRIN 81 MG PO CHEW
324.0000 mg | CHEWABLE_TABLET | Freq: Once | ORAL | Status: AC
  Administered 2017-04-10: 324 mg via ORAL

## 2017-04-10 MED ORDER — IOPAMIDOL (ISOVUE-370) INJECTION 76%
100.0000 mL | Freq: Once | INTRAVENOUS | Status: AC | PRN
  Administered 2017-04-10: 100 mL via INTRAVENOUS

## 2017-04-10 NOTE — ED Notes (Signed)
Patient returned from CT

## 2017-04-10 NOTE — ED Triage Notes (Signed)
[facility] with initial c/o shob/CP. Patient arrives to [facility] ED with c/o chest pain 3/10 s/p administration of 3x nitro and 324 asa. Recent HX DVT, has since discontinued anticoagulant therapy

## 2017-04-10 NOTE — ED Notes (Signed)
Patient transported to CT 

## 2017-04-10 NOTE — ED Provider Notes (Signed)
[facility] ____________________________________________  Time seen: Approximately 10:43 AM  I have reviewed the triage vital signs and the nursing notes.   HISTORY  Chief Complaint Chest Pain  HPI Sean Chen is a 46 y.o. male with past history of left leg DVT, peripheral vascular disease, smoker, chronic left shoulder pain, presenting for evaluation of acute onset of chest pain with accompanying shortness of breath.  Patient reports approximately 1 hour prior to arrival he began having moderate to severe left-sided chest pain that went into his left shoulder and somewhat down his left arm.  Also reports feeling that he could not take a deep breath, and reports gradual onset of what he describes as a lot of pain with trying to take a deep breath.  Denies history of similar in the past.  States no longer on Eliquis and has been cleared by vascular.  No medications on a daily basis.  Denies any recent sickness, fevers, trauma or known trigger for current complaints.  States regular alcohol use, 6 pack last night.  Denies any drug use.  Denies other aggravating or alleviating factors.Denies abdominal pain, dysuria, extremity pain, extremity swelling or rash. Denies recent sickness. Denies recent antibiotic use.   Cyndie Mull, DO: PCP   Past Medical History:  Diagnosis Date  . Arthritis    right foot  . 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 Peripheral vascular disease [facility])     Patient Active Problem List   Diagnosis Date Noted  . DJD (degenerative joint disease) of knee 08/02/2016  . Acute deep vein thrombosis (DVT) of popliteal vein of left lower extremity (HCC) 04/07/2016  . Pain and swelling of lower extremity 04/07/2016  . Tobacco dependence 04/07/2016    Past Surgical History:  Procedure Laterality Date  . APPENDECTOMY    . CYST EXCISION  2000   buttocks  . FOOT ARTHRODESIS Right 11/12/2016   Procedure: ARTHRODESIS  FOOT-SUBTALOR JOINT FUSION;  Surgeon: Gwyneth Revels, DPM;  Location: [facility] ORS;  Service: Podiatry;  Laterality: Right;  . ORIF ANKLE FRACTURE Right 01/01/2015   Procedure: OPEN REDUCTION INTERNAL FIXATION (ORIF) CALCANEUS;  Surgeon: Gwyneth Revels, DPM;  Location: [facility];  Service: Podiatry;  Laterality: Right;  POPLITEAL  . WISDOM TOOTH EXTRACTION  1999   4 teeth    No current facility-administered medications for this encounter.   Current Outpatient Prescriptions:  .  apixaban (ELIQUIS) 5 MG TABS tablet, Take by mouth., Disp: , Rfl:  .  oxyCODONE-acetaminophen (ROXICET) 5-325 MG tablet, Take 1-2 tablets by mouth every 4 (four) hours as needed for severe pain. (Patient not taking: Reported on 01/31/2017), Disp: 30 tablet, Rfl: 0  Allergies Patient has no known allergies.  Family History  Problem Relation Age of Onset  . Family history unknown: Yes    Social History Social History  Substance Use Topics  . Smoking status: Current Every Day Smoker    Packs/day: 1.00    Types: Cigarettes  . Smokeless tobacco: Former Neurosurgeon  . Alcohol use 7.2 oz/week    12 Cans of beer per week     Comment: 6-12 cans of beer on weekends    Review of Systems Constitutional: No fever/chills Eyes: No visual changes. ENT: No sore throat. Cardiovascular: As above.  Respiratory: As above. Gastrointestinal: No abdominal pain.  No nausea, no vomiting.  No diarrhea.   Genitourinary: Negative for dysuria. Musculoskeletal: Negative for back pain. Skin: Negative for rash. Neurological: Negative for headaches,  focal weakness or numbness.   ____________________________________________   PHYSICAL EXAM:  VITAL SIGNS: ED Triage Vitals [04/10/17 1013]  Enc Vitals Group     BP (!) 151/99     Pulse Rate 100     Resp 18     Temp 98.2 F (36.8 C)     Temp Source Oral     SpO2 99 %     Weight 175 lb (79.4 kg)     Height 5\' 9"  (1.753 m)     Head Circumference      Peak Flow      Pain  Score 6     Pain Loc      Pain Edu?      Excl. in GC?     Constitutional: Alert and oriented. Well appearing and in no acute distress. Eyes: Conjunctivae are normal.  ENT      Head: Normocephalic and atraumatic.      Nose: No congestion/rhinnorhea.      Mouth/Throat: Mucous membranes are moist.Oropharynx non-erythematous. Cardiovascular: Normal rate, regular rhythm. Grossly normal heart sounds.  Good peripheral circulation. Respiratory: Shallow inhalations. Breath sounds are clear and equal bilaterally. No wheezes, rales, rhonchi. Gastrointestinal: Soft and nontender. No distention.  Musculoskeletal: Bilateral distal radial and pedal pulses equal and easily palpated.Upper extremities nontender to palpation. Hand grips equal.      Right lower leg:  No tenderness or edema.      Left lower leg:  No tenderness or edema.  Neurologic:  Normal speech and language. No gross focal neurologic deficits are appreciated. Speech is normal. 5/5 strength to bilateral upper and lower extremities with normal sensation.  Skin:  Skin is warm, dry and intact. No rash noted. Psychiatric: Mood and affect are normal. Speech and behavior are normal. Patient exhibits appropriate insight and judgment    ___________________________________________   LABS (all labs ordered are listed, but only abnormal results are displayed)  Labs Reviewed - No data to display ____________________________________________  EKG  ED ECG REPORT I, Renford Dills, the attending provider, personally viewed and interpreted this ECG.   Date: 04/10/2017  EKG Time: 1010  Rate: normal sinus rhythm   Rhythm:  normal sinus rhythm  Axis: normal  Intervals:none  ST&T Change:   _________________________________________  RADIOLOGY  No results found. ____________________________________________   PROCEDURES Procedures    INITIAL IMPRESSION / ASSESSMENT AND PLAN / ED COURSE  Pertinent labs & imaging results that were  available during my care of the patient were reviewed by me and considered in my medical decision making (see chart for details).  Patient presenting for acute onset of chest pain with accompanying shortness of breath and pain with deep breath, past history of PVD and DVT, recommend at this time for EMS transfer to emergency room for cardiac evaluation as well as exclusion of pulmonary embolism.  Patient agrees with this. 324 mg chewable aspirin given once in urgent care, IV line established.  Patient transferred to [facility] by EMS.  Patient stable at time of discharge.  Tammy Sours RN charge nurse at [facility] given report.  ____________________________________________   FINAL CLINICAL IMPRESSION(S) / ED DIAGNOSES  Final diagnoses:  Chest pain, unspecified type  Shortness of breath     Discharge Medication List as of 04/10/2017 10:41 AM      Note: This dictation was prepared with Dragon dictation along with smaller phrase technology. Any transcriptional errors that result from this process are unintentional.         Renford Dills,  NP 04/10/17 1221

## 2017-04-10 NOTE — ED Triage Notes (Signed)
Pt reports left shoulder pain starting this a.m. (some hx of chronic shoulder pain but this much worse). Pain was going down into his left bicep. Then started with left sided chest pain and feels SOB. No SOB noted in triage. Pain 6/10
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
## 2017-04-10 NOTE — ED Notes (Signed)
Patient does not appear to be in any acute distress at time of discharge. Patient ambulatory to lobby with steady gate. Patient denies any comments or concerns regarding discharge.  

## 2017-04-10 NOTE — Discharge Instructions (Signed)
You have been seen in the emergency department today for chest pain. Your workup has shown normal results. As we discussed please follow-up with your primary care physician in the next 1-2 days for recheck. Return to the emergency department for any further chest pain, trouble breathing, or any other symptom personally concerning to yourself.  If you have any further chest pain please call the number provided for cardiology to arrange a follow-up appointment for possible stress test.
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
## 2017-05-09 ENCOUNTER — Encounter: Payer: Self-pay | Admitting: *Deleted

## 2017-05-09 ENCOUNTER — Other Ambulatory Visit: Payer: Self-pay

## 2017-05-18 ENCOUNTER — Ambulatory Visit
Admission: RE | Admit: 2017-05-18 | Discharge: 2017-05-18 | Disposition: A | Discharge status: Home / Self Care | Payer: Managed Care, Other (non HMO) | Source: Ambulatory Visit | Attending: Surgery | Admitting: Surgery

## 2017-05-18 ENCOUNTER — Ambulatory Visit: Payer: Managed Care, Other (non HMO) | Admitting: Anesthesiology

## 2017-05-18 ENCOUNTER — Encounter
Admission: RE | Disposition: A | Discharge status: Home / Self Care | Payer: Self-pay | Source: Ambulatory Visit | Attending: Surgery

## 2017-05-18 DIAGNOSIS — F17200 Nicotine dependence, unspecified, uncomplicated: Secondary | ICD-10-CM | POA: Insufficient documentation

## 2017-05-18 DIAGNOSIS — Z86718 Personal history of other venous thrombosis and embolism: Secondary | ICD-10-CM | POA: Insufficient documentation

## 2017-05-18 DIAGNOSIS — M94262 Chondromalacia, left knee: Secondary | ICD-10-CM | POA: Insufficient documentation

## 2017-05-18 DIAGNOSIS — M222X2 Patellofemoral disorders, left knee: Secondary | ICD-10-CM | POA: Diagnosis not present

## 2017-05-18 HISTORY — PX: KNEE ARTHROSCOPY WITH LATERAL RELEASE: SHX5649

## 2017-05-18 SURGERY — ARTHROSCOPY, KNEE, WITH LATERAL RETINACULUM RELEASE
Anesthesia: General | Site: Knee | Laterality: Left | Wound class: Clean

## 2017-05-18 MED ORDER — FENTANYL CITRATE (PF) 100 MCG/2ML IJ SOLN
INTRAMUSCULAR | Status: DC | PRN
  Administered 2017-05-18: 100 ug via INTRAVENOUS

## 2017-05-18 MED ORDER — OXYCODONE HCL 5 MG PO TABS: 5.0000 mg | ORAL_TABLET | ORAL | 0 refills | Status: DC | PRN

## 2017-05-18 MED ORDER — DEXAMETHASONE SODIUM PHOSPHATE 4 MG/ML IJ SOLN
INTRAMUSCULAR | Status: DC | PRN
  Administered 2017-05-18: 4 mg via INTRAVENOUS

## 2017-05-18 MED ORDER — METOCLOPRAMIDE HCL 5 MG/ML IJ SOLN: 5.0000 mg | Freq: Three times a day (TID) | INTRAMUSCULAR | Status: DC | PRN

## 2017-05-18 MED ORDER — POTASSIUM CHLORIDE IN NACL 20-0.9 MEQ/L-% IV SOLN: INTRAVENOUS | Status: DC

## 2017-05-18 MED ORDER — OXYCODONE HCL 5 MG/5ML PO SOLN: 5.0000 mg | Freq: Once | ORAL | Status: AC | PRN

## 2017-05-18 MED ORDER — LIDOCAINE HCL (CARDIAC) 20 MG/ML IV SOLN
INTRAVENOUS | Status: DC | PRN
  Administered 2017-05-18: 50 mg via INTRATRACHEAL

## 2017-05-18 MED ORDER — PROPOFOL 10 MG/ML IV BOLUS
INTRAVENOUS | Status: DC | PRN
  Administered 2017-05-18: 20 mg via INTRAVENOUS
  Administered 2017-05-18: 170 mg via INTRAVENOUS

## 2017-05-18 MED ORDER — MIDAZOLAM HCL 5 MG/5ML IJ SOLN
INTRAMUSCULAR | Status: DC | PRN
  Administered 2017-05-18: 2 mg via INTRAVENOUS

## 2017-05-18 MED ORDER — OXYCODONE HCL 5 MG PO TABS: 5.0000 mg | ORAL_TABLET | ORAL | Status: DC | PRN

## 2017-05-18 MED ORDER — ONDANSETRON HCL 4 MG PO TABS: 4.0000 mg | ORAL_TABLET | Freq: Four times a day (QID) | ORAL | Status: DC | PRN

## 2017-05-18 MED ORDER — LIDOCAINE-EPINEPHRINE 1 %-1:100000 IJ SOLN
INTRAMUSCULAR | Status: DC | PRN
  Administered 2017-05-18: 30 mL

## 2017-05-18 MED ORDER — KETOROLAC TROMETHAMINE 30 MG/ML IJ SOLN
INTRAMUSCULAR | Status: DC | PRN
  Administered 2017-05-18: 30 mg via INTRAVENOUS

## 2017-05-18 MED ORDER — GLYCOPYRROLATE 0.2 MG/ML IJ SOLN
INTRAMUSCULAR | Status: DC | PRN
  Administered 2017-05-18: 0.1 mg via INTRAVENOUS

## 2017-05-18 MED ORDER — METOCLOPRAMIDE HCL 5 MG PO TABS: 5.0000 mg | ORAL_TABLET | Freq: Three times a day (TID) | ORAL | Status: DC | PRN

## 2017-05-18 MED ORDER — BUPIVACAINE-EPINEPHRINE (PF) 0.5% -1:200000 IJ SOLN
INTRAMUSCULAR | Status: DC | PRN
Start: 2017-05-18 — End: 2017-05-18
  Administered 2017-05-18: 30 mL via PERINEURAL

## 2017-05-18 MED ORDER — ONDANSETRON HCL 4 MG/2ML IJ SOLN
INTRAMUSCULAR | Status: DC | PRN
  Administered 2017-05-18: 4 mg via INTRAVENOUS

## 2017-05-18 MED ORDER — ACETAMINOPHEN 10 MG/ML IV SOLN
1000.0000 mg | Freq: Once | INTRAVENOUS | Status: DC | PRN
  Administered 2017-05-18: 1000 mg via INTRAVENOUS

## 2017-05-18 MED ORDER — OXYCODONE HCL 5 MG PO TABS
5.0000 mg | ORAL_TABLET | Freq: Once | ORAL | Status: AC | PRN
  Administered 2017-05-18: 5 mg via ORAL

## 2017-05-18 MED ORDER — LACTATED RINGERS IV SOLN: INTRAVENOUS | Status: DC

## 2017-05-18 MED ORDER — ONDANSETRON HCL 4 MG/2ML IJ SOLN: 4.0000 mg | Freq: Four times a day (QID) | INTRAMUSCULAR | Status: DC | PRN

## 2017-05-18 MED ORDER — FENTANYL CITRATE (PF) 100 MCG/2ML IJ SOLN: 25.0000 ug | INTRAMUSCULAR | Status: DC | PRN

## 2017-05-18 MED ORDER — DEXTROSE 5 % IV SOLN
2000.0000 mg | Freq: Once | INTRAVENOUS | Status: AC
  Administered 2017-05-18: 2000 mg via INTRAVENOUS

## 2017-05-18 MED ORDER — LACTATED RINGERS IV SOLN
INTRAVENOUS | Status: DC
  Administered 2017-05-18: 12:00:00 via INTRAVENOUS

## 2017-05-18 MED ORDER — ONDANSETRON HCL 4 MG/2ML IJ SOLN
4.0000 mg | Freq: Once | INTRAMUSCULAR | Status: DC | PRN
Start: 2017-05-18 — End: 2017-05-18

## 2017-05-18 SURGICAL SUPPLY — 25 items
BANDAGE ELASTIC 6 LF NS (GAUZE/BANDAGES/DRESSINGS) ×3 IMPLANT
BLADE FULL RADIUS 3.5 (BLADE) ×3 IMPLANT
CHLORAPREP W/TINT 26ML (MISCELLANEOUS) ×3 IMPLANT
COVER LIGHT HANDLE UNIVERSAL (MISCELLANEOUS) ×6 IMPLANT
CUFF TOURN SGL QUICK 24 (TOURNIQUET CUFF) ×2
CUFF TRNQT CYL 24X4X40X1 (TOURNIQUET CUFF) ×1 IMPLANT
DRAPE IMP U-DRAPE 54X76 (DRAPES) ×3 IMPLANT
GAUZE SPONGE 4X4 12PLY STRL (GAUZE/BANDAGES/DRESSINGS) ×3 IMPLANT
GLOVE BIO SURGEON STRL SZ8 (GLOVE) ×6 IMPLANT
GLOVE INDICATOR 8.0 STRL GRN (GLOVE) ×3 IMPLANT
GOWN STRL REUS W/ TWL LRG LVL3 (GOWN DISPOSABLE) ×1 IMPLANT
GOWN STRL REUS W/ TWL XL LVL3 (GOWN DISPOSABLE) ×1 IMPLANT
GOWN STRL REUS W/TWL LRG LVL3 (GOWN DISPOSABLE) ×2
GOWN STRL REUS W/TWL XL LVL3 (GOWN DISPOSABLE) ×2
IV LACTATED RINGER IRRG 3000ML (IV SOLUTION) ×4
IV LR IRRIG 3000ML ARTHROMATIC (IV SOLUTION) ×2 IMPLANT
KIT ROOM TURNOVER OR (KITS) ×3 IMPLANT
MANIFOLD 4PT FOR NEPTUNE1 (MISCELLANEOUS) ×3 IMPLANT
NEEDLE HYPO 21X1.5 SAFETY (NEEDLE) ×6 IMPLANT
PACK ARTHROSCOPY KNEE (MISCELLANEOUS) ×3 IMPLANT
STRAP BODY AND KNEE 60X3 (MISCELLANEOUS) ×3 IMPLANT
SUT PROLENE 4 0 PS 2 18 (SUTURE) ×3 IMPLANT
SYR 50ML LL SCALE MARK (SYRINGE) ×3 IMPLANT
TUBING ARTHRO INFLOW-ONLY STRL (TUBING) ×3 IMPLANT
WAND 30 DEG SABER W/CORD (SURGICAL WAND) ×3 IMPLANT

## 2017-05-18 NOTE — Anesthesia Preprocedure Evaluation (Signed)
Anesthesia Evaluation  Patient identified by MRN, date of birth, ID band Patient awake    History of Anesthesia Complications Negative for: history of anesthetic complications  Airway Mallampati: III  TM Distance: >3 FB Neck ROM: Full    Dental  (+)    Pulmonary Current Smoker (1 ppd),    Pulmonary exam normal breath sounds clear to auscultation       Cardiovascular Exercise Tolerance: Good + DVT (LLE 03/2016; not currently on anticoagulation)  Normal cardiovascular exam Rhythm:Regular Rate:Normal     Neuro/Psych negative neurological ROS     GI/Hepatic negative GI ROS,   Endo/Other  negative endocrine ROS  Renal/GU negative Renal ROS     Musculoskeletal   Abdominal   Peds  Hematology negative hematology ROS (+)   Anesthesia Other Findings   Reproductive/Obstetrics                             Anesthesia Physical Anesthesia Plan  ASA: II  Anesthesia Plan: General   Post-op Pain Management:    Induction: Intravenous  PONV Risk Score and Plan: 1 and Ondansetron  Airway Management Planned: LMA  Additional Equipment:   Intra-op Plan:   Post-operative Plan: Extubation in OR  Informed Consent: I have reviewed the patients History and Physical, chart, labs and discussed the procedure including the risks, benefits and alternatives for the proposed anesthesia with the patient or authorized representative who has indicated his/her understanding and acceptance.     Plan Discussed with: CRNA  Anesthesia Plan Comments:         Anesthesia Quick Evaluation

## 2017-05-18 NOTE — Op Note (Signed)
05/18/2017  2:31 PM  Patient:   Sean LeechVictor Chen  Pre-Op Diagnosis:   Patellofemoral syndrome with chondromalacia, left knee.  Postoperative diagnosis:   Same with lateral patellar compression syndrome, left knee.  Procedure:   Arthroscopic debridement with abrasion chondroplasty of femoral trochlea and arthroscopic lateral release, left knee.  Surgeon:   Maryagnes AmosJ. Jeffrey Poggi, M.D.  Anesthesia:   General LMA.  Findings:   As above. There were grade 3 chondromalacial changes involving the central portion of the femoral trochlea, as well as deeper fissuring of the lateral patellar facet. The articular surfaces of the medial and lateral compartments were in satisfactory condition. The medial and lateral menisci also were in satisfactory condition, as were the anterior and posterior cruciate ligaments.  Complications:   None.  EBL:   <5 cc.  Total fluids:   900 cc of crystalloid.  Tourniquet time:   None  Drains:   None  Closure:   4-0 Prolene interrupted sutures.  Brief clinical note:   The patient is a 46 year old male with a long history of anterior left knee pain. The symptoms have progressed despite medications, activity modification, steroid injections, etc. His history and examination are consistent with patellofemoral syndrome, confirmed by MRI scan. The patient presents at this time for arthroscopy, debridement, and possible arthroscopic lateral release.  Procedure:   The patient was brought into the operating room and lain in the supine position. After adequate general laryngeal mask anesthesia was obtained, a timeout was performed to verify the appropriate side. The patient's left knee was injected sterilely using a solution of 30 cc of 1% lidocaine and 30 cc of 0.5% Sensorcaine with epinephrine. The left lower extremity was prepped with ChloraPrep solution before being draped sterilely. Preoperative antibiotics were administered. The expected portal sites were injected with 0.5%  Sensorcaine with epinephrine before the camera was placed in the anterolateral portal and instrumentation performed through the anteromedial portal. The knee was sequentially examined beginning in the suprapatellar pouch, then progressing to the patellofemoral space, the medial gutter compartment, the notch, and finally the lateral compartment and gutter. The findings were as described above. Abundant reactive synovial tissues anteriorly were debrided using the full-radius resector in order to improve visualization. The medial and lateral menisci were carefully probed, confirmed the above-noted findings. The areas of loose articular cartilage involving the femoral trochlea were debrided back to stable margins using the full-radius resector.   Patellar tracking was assessed. The patella appeared to be tracking somewhat laterally and demonstrated evidence of patellar tilt, resulting in lateral patellar compression. Therefore, it was elected to proceed with an arthroscopic lateral release. The camera was repositioned in the anteromedial portal and the Saber device introduced through the anterolateral portal. Under arthroscopic visualization, the lateral retinaculum was released from proximal to distal. Subsequently, patellar tracking was assessed and found to be more centralized and with less patellar tilt. Hemostasis was achieved using the coagulation setting on the saber device. The instruments were removed from the joint after suctioning the excess fluid.   The portal sites were closed using 4-0 Prolene interrupted sutures before a sterile bulky dressing was applied to the knee. The patient was then awakened, extubated, and returned to the recovery room in satisfactory condition after tolerating the procedure well.

## 2017-05-18 NOTE — H&P (Signed)
Paper H&P to be scanned into permanent record. H&P reviewed and patient re-examined. No changes. 

## 2017-05-18 NOTE — Anesthesia Postprocedure Evaluation (Signed)
Anesthesia Post Note  Patient: Sean Chen  Procedure(s) Performed: KNEE ARTHROSCOPY WITH DEBRIDEMENT and LATERAL RELEASE (Left Knee)  Patient location during evaluation: PACU Anesthesia Type: General Level of consciousness: awake and alert, oriented and patient cooperative Pain management: pain level controlled Vital Signs Assessment: post-procedure vital signs reviewed and stable Respiratory status: spontaneous breathing, nonlabored ventilation and respiratory function stable Cardiovascular status: blood pressure returned to baseline and stable Postop Assessment: adequate PO intake Anesthetic complications: no    Reed BreechAndrea Surena Welge

## 2017-05-18 NOTE — Transfer of Care (Signed)
Immediate Anesthesia Transfer of Care Note  Patient: Sean Chen  Procedure(s) Performed: KNEE ARTHROSCOPY WITH DEBRIDEMENT AND POSSIBLE LATERAL RELEASE (Left )  Patient Location: PACU  Anesthesia Type: General  Level of Consciousness: awake, alert  and patient cooperative  Airway and Oxygen Therapy: Patient Spontanous Breathing and Patient connected to supplemental oxygen  Post-op Assessment: Post-op Vital signs reviewed, Patient's Cardiovascular Status Stable, Respiratory Function Stable, Patent Airway and No signs of Nausea or vomiting  Post-op Vital Signs: Reviewed and stable  Complications: No apparent anesthesia complications

## 2017-05-18 NOTE — Discharge Instructions (Signed)
General Anesthesia, Adult, Care After These instructions provide you with information about caring for yourself after your procedure. Your health care provider may also give you more specific instructions. Your treatment has been planned according to current medical practices, but problems sometimes occur. Call your health care provider if you have any problems or questions after your procedure. What can I expect after the procedure? After the procedure, it is common to have:  Vomiting.  A sore throat.  Mental slowness.  It is common to feel:  Nauseous.  Cold or shivery.  Sleepy.  Tired.  Sore or achy, even in parts of your body where you did not have surgery.  Follow these instructions at home: For at least 24 hours after the procedure:  Do not: ? Participate in activities where you could fall or become injured. ? Drive. ? Use heavy machinery. ? Drink alcohol. ? Take sleeping pills or medicines that cause drowsiness. ? Make important decisions or sign legal documents. ? Take care of children on your own.  Rest. Eating and drinking  If you vomit, drink water, juice, or soup when you can drink without vomiting.  Drink enough fluid to keep your urine clear or pale yellow.  Make sure you have little or no nausea before eating solid foods.  Follow the diet recommended by your health care provider. General instructions  Have a responsible adult stay with you until you are awake and alert.  Return to your normal activities as told by your health care provider. Ask your health care provider what activities are safe for you.  Take over-the-counter and prescription medicines only as told by your health care provider.  If you smoke, do not smoke without supervision.  Keep all follow-up visits as told by your health care provider. This is important. Contact a health care provider if:  You continue to have nausea or vomiting at home, and medicines are not helpful.  You  cannot drink fluids or start eating again.  You cannot urinate after 8-12 hours.  You develop a skin rash.  You have fever.  You have increasing redness at the site of your procedure. Get help right away if:  You have difficulty breathing.  You have chest pain.  You have unexpected bleeding.  You feel that you are having a life-threatening or urgent problem. This information is not intended to replace advice given to you by your health care provider. Make sure you discuss any questions you have with your health care provider. Document Released: 09/06/2000 Document Revised: 11/03/2015 Document Reviewed: 05/15/2015 Elsevier Interactive Patient Education  2018 [employer]   Keep dressing dry and intact.  May shower after dressing changed on post-op day #4 (Sunday).  Cover sutures with Band-Aids after drying off. Apply ice frequently to knee. Take Aleve 2 tablets BID with meals for 7-10 days, then as necessary. Take pain medication as prescribed or ES Tylenol when needed.  May weight-bear as tolerated - use crutches or walker as needed. Follow-up in 10-14 days or as scheduled.

## 2017-05-18 NOTE — Anesthesia Procedure Notes (Signed)
Procedure Name: LMA Insertion Date/Time: 05/18/2017 1:39 PM Performed by: Jimmy PicketAmyot, Madden Garron, CRNA Pre-anesthesia Checklist: Patient identified, Emergency Drugs available, Suction available, Timeout performed and Patient being monitored Patient Re-evaluated:Patient Re-evaluated prior to induction Oxygen Delivery Method: Circle system utilized Preoxygenation: Pre-oxygenation with 100% oxygen Induction Type: IV induction LMA: LMA inserted LMA Size: 4.0 Number of attempts: 1 Placement Confirmation: positive ETCO2 and breath sounds checked- equal and bilateral Tube secured with: Tape

## 2017-05-20 ENCOUNTER — Encounter: Payer: Self-pay | Admitting: Surgery

## 2018-04-05 ENCOUNTER — Other Ambulatory Visit: Payer: Self-pay | Admitting: Surgery

## 2018-04-05 DIAGNOSIS — M94262 Chondromalacia, left knee: Secondary | ICD-10-CM

## 2018-04-05 DIAGNOSIS — M2242 Chondromalacia patellae, left knee: Secondary | ICD-10-CM

## 2018-04-25 ENCOUNTER — Ambulatory Visit
Admission: RE | Admit: 2018-04-25 | Discharge: 2018-04-25 | Disposition: A | Discharge status: Home / Self Care | Payer: Managed Care, Other (non HMO) | Source: Ambulatory Visit | Attending: Surgery | Admitting: Surgery

## 2018-04-25 DIAGNOSIS — M2242 Chondromalacia patellae, left knee: Secondary | ICD-10-CM | POA: Insufficient documentation

## 2018-04-25 DIAGNOSIS — M94262 Chondromalacia, left knee: Secondary | ICD-10-CM

## 2019-04-03 ENCOUNTER — Emergency Department
Admission: EM | Admit: 2019-04-03 | Discharge: 2019-04-03 | Disposition: A | Discharge status: Home / Self Care | Payer: No Typology Code available for payment source | Attending: Emergency Medicine | Admitting: Emergency Medicine

## 2019-04-03 ENCOUNTER — Ambulatory Visit
Admission: EM | Admit: 2019-04-03 | Discharge: 2019-04-03 | Disposition: A | Discharge status: Home / Self Care | Payer: No Typology Code available for payment source | Attending: Family Medicine | Admitting: Family Medicine

## 2019-04-03 ENCOUNTER — Encounter: Payer: Self-pay | Admitting: Emergency Medicine

## 2019-04-03 ENCOUNTER — Emergency Department: Payer: No Typology Code available for payment source

## 2019-04-03 ENCOUNTER — Other Ambulatory Visit: Payer: Self-pay

## 2019-04-03 ENCOUNTER — Ambulatory Visit (INDEPENDENT_AMBULATORY_CARE_PROVIDER_SITE_OTHER): Payer: No Typology Code available for payment source

## 2019-04-03 DIAGNOSIS — Y929 Unspecified place or not applicable: Secondary | ICD-10-CM | POA: Diagnosis not present

## 2019-04-03 DIAGNOSIS — Y999 Unspecified external cause status: Secondary | ICD-10-CM | POA: Insufficient documentation

## 2019-04-03 DIAGNOSIS — Y939 Activity, unspecified: Secondary | ICD-10-CM | POA: Diagnosis not present

## 2019-04-03 DIAGNOSIS — S299XXA Unspecified injury of thorax, initial encounter: Secondary | ICD-10-CM | POA: Diagnosis present

## 2019-04-03 DIAGNOSIS — W010XXA Fall on same level from slipping, tripping and stumbling without subsequent striking against object, initial encounter: Secondary | ICD-10-CM | POA: Insufficient documentation

## 2019-04-03 DIAGNOSIS — S2232XA Fracture of one rib, left side, initial encounter for closed fracture: Secondary | ICD-10-CM | POA: Diagnosis not present

## 2019-04-03 DIAGNOSIS — R079 Chest pain, unspecified: Secondary | ICD-10-CM

## 2019-04-03 DIAGNOSIS — F17210 Nicotine dependence, cigarettes, uncomplicated: Secondary | ICD-10-CM | POA: Insufficient documentation

## 2019-04-03 LAB — COMPREHENSIVE METABOLIC PANEL
ALT: 28 U/L (ref 0–44)
AST: 33 U/L (ref 15–41)
Albumin: 3.7 g/dL (ref 3.5–5.0)
Alkaline Phosphatase: 73 U/L (ref 38–126)
Anion gap: 13 (ref 5–15)
BUN: 9 mg/dL (ref 6–20)
CO2: 21 mmol/L — ABNORMAL LOW (ref 22–32)
Calcium: 8.9 mg/dL (ref 8.9–10.3)
Chloride: 102 mmol/L (ref 98–111)
Creatinine, Ser: 0.81 mg/dL (ref 0.61–1.24)
GFR calc Af Amer: >60 mL/min (ref >60)
GFR calc non Af Amer: >60 mL/min (ref >60)
Glucose, Bld: 100 mg/dL — ABNORMAL HIGH (ref 70–99)
Potassium: 4 mmol/L (ref 3.5–5.1)
Sodium: 136 mmol/L (ref 135–145)
Total Bilirubin: 0.6 mg/dL (ref 0.3–1.2)
Total Protein: 7 g/dL (ref 6.5–8.1)

## 2019-04-03 LAB — CBC WITH DIFFERENTIAL/PLATELET
Abs Immature Granulocytes: 0.01 10*3/uL (ref 0.00–0.07)
Basophils Absolute: 0.1 10*3/uL (ref 0.0–0.1)
Basophils Relative: 1 %
Eosinophils Absolute: 0.1 10*3/uL (ref 0.0–0.5)
Eosinophils Relative: 2 %
HCT: 44.8 % (ref 39.0–52.0)
Hemoglobin: 15.5 g/dL (ref 13.0–17.0)
Immature Granulocytes: 0 %
Lymphocytes Relative: 22 %
Lymphs Abs: 1.2 10*3/uL (ref 0.7–4.0)
MCH: 32.2 pg (ref 26.0–34.0)
MCHC: 34.6 g/dL (ref 30.0–36.0)
MCV: 92.9 fL (ref 80.0–100.0)
Monocytes Absolute: 0.7 10*3/uL (ref 0.1–1.0)
Monocytes Relative: 13 %
Neutro Abs: 3.4 10*3/uL (ref 1.7–7.7)
Neutrophils Relative %: 62 %
Platelets: 201 10*3/uL (ref 150–400)
RBC: 4.82 MIL/uL (ref 4.22–5.81)
RDW: 13.1 % (ref 11.5–15.5)
WBC: 5.4 10*3/uL (ref 4.0–10.5)
nRBC: 0 % (ref 0.0–0.2)

## 2019-04-03 LAB — TROPONIN I (HIGH SENSITIVITY): Troponin I (High Sensitivity): <2 ng/L (ref <18)

## 2019-04-03 LAB — FIBRIN DERIVATIVES D-DIMER (ARMC ONLY): Fibrin derivatives D-dimer (ARMC): 657.82 ng/mL (FEU) — ABNORMAL HIGH (ref 0.00–499.00)

## 2019-04-03 MED ORDER — IOHEXOL 350 MG/ML SOLN
75.0000 mL | Freq: Once | INTRAVENOUS | Status: AC | PRN
  Administered 2019-04-03: 75 mL via INTRAVENOUS
  Filled 2019-04-03: qty 75

## 2019-04-03 MED ORDER — OXYCODONE-ACETAMINOPHEN 5-325 MG PO TABS: 1.0000 | ORAL_TABLET | Freq: Four times a day (QID) | ORAL | 0 refills | Status: AC | PRN

## 2019-04-03 MED ORDER — KETOROLAC TROMETHAMINE 30 MG/ML IJ SOLN
30.0000 mg | Freq: Once | INTRAMUSCULAR | Status: AC
  Administered 2019-04-03: 30 mg via INTRAMUSCULAR
  Filled 2019-04-03: qty 1

## 2019-04-03 NOTE — ED Notes (Signed)
Patient transported to CT 

## 2019-04-03 NOTE — ED Triage Notes (Signed)
Sent here from urgent care for left side chest pain--under left arm.  Hurts to breath.

## 2019-04-03 NOTE — ED Provider Notes (Signed)
[facility] ____________________________________________  Time seen: Approximately 10:30 AM  I have reviewed the triage vital signs and the nursing notes.   HISTORY  Chief Complaint Fall and Chest Pain   HPI Sean Chen is a 48 y.o. male past medical history of PVD, DVT presenting for evaluation of acute onset left-sided chest pain.  Patient reports 1 week ago he slipped on a wet deck, and fell backwards onto his left ribs.  Reports over several days he had left rib pain, particular with movement and deep breath.  Reports however this weekend pain had nearly fully resolved and was only having minimal tenderness with palpation.  Reports 2 hours prior to arrival today he began having acute onset left chest pain that he describes as a "spear going through my chest ".  States pain is sharp and stabbing.  States he is able to reproduce pain minimally by touching and holding pressure helps pain minimally.  However reports pain feels deeper.  Minimal accompanying shortness of breath as described as a guarding breath.  No cough, fevers or recent sickness.  Denies pain radiation, paresthesias, arm discomfort, neck discomfort.  No other injuries.  Denies aggravating or relieving factors otherwise.  Current smoker.  [place]   Past Medical History:  Diagnosis Date  . Arthritis    right foot  . 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 Peripheral vascular disease [facility](HCC)     Patient Active Problem List   Diagnosis Date Noted  . DJD (degenerative joint disease) of knee 08/02/2016  . Acute deep vein thrombosis (DVT) of popliteal vein of left lower extremity (HCC) 04/07/2016  . Pain and swelling of lower extremity 04/07/2016  . Tobacco dependence 04/07/2016    Past Surgical History:  Procedure Laterality Date  . APPENDECTOMY    . CYST EXCISION  2000   buttocks  . FOOT ARTHRODESIS Right 11/12/2016   Procedure: ARTHRODESIS  FOOT-SUBTALOR JOINT FUSION;  Surgeon: Gwyneth RevelsFowler, Justin, DPM;  Location: [facility];  Service: Podiatry;  Laterality: Right;  . KNEE ARTHROSCOPY WITH LATERAL RELEASE Left 05/18/2017   Procedure: KNEE ARTHROSCOPY WITH DEBRIDEMENT and LATERAL RELEASE;  Surgeon: Christena FlakePoggi, John J, MD;  [facility];  Service: Orthopedics;  Laterality: Left;  . ORIF ANKLE FRACTURE Right 01/01/2015   Procedure: OPEN REDUCTION INTERNAL FIXATION (ORIF) CALCANEUS;  Surgeon: Gwyneth RevelsJustin Fowler, DPM;  [facility];  Service: Podiatry;  Laterality: Right;  POPLITEAL  . WISDOM TOOTH EXTRACTION  1999   4 teeth     No current facility-administered medications for this encounter.  No current outpatient medications on file.  Allergies Patient has no known allergies.  Family History  Family history unknown: Yes    Social History Social History   Tobacco Use  . Smoking status: Current Every Day Smoker    Packs/day: 1.00    Types: Cigarettes  . Smokeless tobacco: Former Engineer, waterUser  Substance Use Topics  . Alcohol use: Yes    Alcohol/week: 12.0 standard drinks    Types: 12 Cans of beer per week    Comment: 6-12 cans of beer on weekends  . Drug use: No    Review of Systems Constitutional: No fever ENT: No sore throat. Cardiovascular: Positive chest pain. Respiratory: As above.  Gastrointestinal: No abdominal pain.   Musculoskeletal: Positive rib pain.  Skin: Negative for rash.   ____________________________________________   PHYSICAL EXAM:  VITAL SIGNS: ED Triage Vitals  Enc Vitals Group  BP 04/03/19 0922 (!) 149/105     Pulse Rate 04/03/19 0922 (!) 108     Resp 04/03/19 0922 19     Temp 04/03/19 0922 98 F (36.7 C)     Temp Source 04/03/19 0922 Oral     SpO2 04/03/19 0922 97 %     Weight 04/03/19 0921 180 lb (81.6 kg)     Height 04/03/19 0921 5\' 9"  (1.753 m)     Head Circumference --      Peak Flow --      Pain Score 04/03/19 0919 7     Pain Loc --      Pain Edu? --       Excl. in GC? --     Constitutional: Alert and oriented. Appears uncomfortable. Holds hand to left anterior chest.  ENT      Head: Normocephalic Cardiovascular: Normal rate, regular rhythm. Grossly normal heart sounds.  Good peripheral circulation. Respiratory: Normal respiratory effort without tachypnea nor retractions. Breath sounds are clear and equal bilaterally. No wheezes, rales, rhonchi. Gastrointestinal: Soft and nontender.  Musculoskeletal: No midline cervical, thoracic or lumbar tenderness to palpation.  Left mid posterior to anterior lateral ribs tenderness to direct palpation, posterior mid ribs mild ecchymosis, no palpable rib fracture. chest otherwise nontender. Neurologic:  Normal speech and language.  Skin:  Skin is warm, dry and intact. No rash noted. Psychiatric: Mood and affect are normal. Speech and behavior are normal. Patient exhibits appropriate insight and judgment   ___________________________________________   LABS (all labs ordered are listed, but only abnormal results are displayed)  Labs Reviewed - No data to display ____________________________________________  EKG  ED ECG REPORT I, 04/05/19, the attending provider, personally viewed and interpreted this ECG.   Date: 04/03/2019  EKG Time: 0933   Rate: 93  Rhythm: normal EKG  Axis: normal  Intervals:none  ST&T Change: none  RADIOLOGY  Dg Ribs Unilateral W/chest Left  Result Date: 04/03/2019 CLINICAL DATA:  Chest pain after fall. EXAM: LEFT RIBS AND CHEST - 3+ VIEW COMPARISON:  None. FINDINGS: No fracture or other bone lesions are seen involving the ribs. There is no evidence of pneumothorax or pleural effusion. Both lungs are clear. Heart size and mediastinal contours are within normal limits. IMPRESSION: Negative. Electronically Signed   By: 04/05/2019 M.D.   On: 04/03/2019 10:04   ____________________________________________   PROCEDURES Procedures    INITIAL IMPRESSION /  ASSESSMENT AND PLAN / ED COURSE  Pertinent labs & imaging results that were available during my care of the patient were reviewed by me and considered in my medical decision making (see chart for details).  Present for evaluation of cute onset left sided chest pain that he describes as sharp and stabbing present for the last 2 hours.  Recent fall with left-sided injury to ribs 1 week ago but reports pain had mostly resolved, but with acute onset today of pain.  Pain not fully reproducible by exam, per patient.  Rib and chest x-rays reviewed and unremarkable.  Due to patient describing pain as severe and stabbing with past medical history recommend further evaluation at emergency room at this time.  Patient agreed to plan.  Patient declined EMS transport and reports he will drive himself directly and verbalized understanding of risks associated with this.  Stable at discharge.  ____________________________________________   FINAL CLINICAL IMPRESSION(S) / ED DIAGNOSES  Final diagnoses:  Left-sided chest pain     ED Discharge Orders    None  Note: This dictation was prepared with Dragon dictation along with smaller phrase technology. Any transcriptional errors that result from this process are unintentional.         Marylene Land, NP 04/03/19 1303

## 2019-04-03 NOTE — ED Notes (Signed)
Mechanical fall last week, c/o LFT sided rib cage area pain. PT holding side

## 2019-04-03 NOTE — ED Notes (Signed)
Spoke to Bruce in lab she has a blue,green,and purple top tubes. Adding the test on.

## 2019-04-03 NOTE — ED Provider Notes (Signed)
[facility] Emergency Department Provider Note  ____________________________________________   First MD Initiated Contact with Patient 04/03/19 1127     (approximate)  I have reviewed the triage vital signs and the nursing notes.   HISTORY  Chief Complaint Chest Pain   HPI Sean LeechVictor Chen is a 48 y.o. male to the ED after [facility] and sent to the ED for further evaluation of his left-sided chest pain.  Patient states that he fell on a wet deck and landed on his left ribs approximately 1 week ago.  He reports that he thought he was getting better however in the last day he states that movement and deep inspiration increases his pain.  He denies any shortness of breath but states that the pain is severe and he is breathing more shallow due to the sharp nature of his pain.  He denies any injury to his head or loss of consciousness.  X-rays at the urgent care was negative for rib fracture.  There was a concern because of his past medical history for DVT and his smoking history.  Patient rates his pain as 7 out of 10.  Patient drove himself to the ED.      Past Medical History:  Diagnosis Date  . Arthritis    right foot  . 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 Peripheral vascular disease [facility](HCC)     Patient Active Problem List   Diagnosis Date Noted  . DJD (degenerative joint disease) of knee 08/02/2016  . Acute deep vein thrombosis (DVT) of popliteal vein of left lower extremity (HCC) 04/07/2016  . Pain and swelling of lower extremity 04/07/2016  . Tobacco dependence 04/07/2016    Past Surgical History:  Procedure Laterality Date  . APPENDECTOMY    . CYST EXCISION  2000   buttocks  . FOOT ARTHRODESIS Right 11/12/2016   Procedure: ARTHRODESIS FOOT-SUBTALOR JOINT FUSION;  Surgeon: Gwyneth RevelsFowler, Justin, DPM;  Location: [facility];  Service: Podiatry;  Laterality: Right;  . KNEE ARTHROSCOPY WITH LATERAL RELEASE Left  05/18/2017   Procedure: KNEE ARTHROSCOPY WITH DEBRIDEMENT and LATERAL RELEASE;  Surgeon: Christena FlakePoggi, John J, MD;  [facility];  Service: Orthopedics;  Laterality: Left;  . ORIF ANKLE FRACTURE Right 01/01/2015   Procedure: OPEN REDUCTION INTERNAL FIXATION (ORIF) CALCANEUS;  Surgeon: Gwyneth RevelsJustin Fowler, DPM;  [facility];  Service: Podiatry;  Laterality: Right;  POPLITEAL  . WISDOM TOOTH EXTRACTION  1999   4 teeth    Prior to Admission medications   Medication Sig Start Date End Date Taking? Authorizing Provider  oxyCODONE-acetaminophen (PERCOCET) 5-325 MG tablet Take 1 tablet by mouth every 6 (six) hours as needed for severe pain. 04/03/19   Tommi RumpsSummers,  L, PA-C    Allergies Patient has no known allergies.  Family History  Family history unknown: Yes    Social History Social History   Tobacco Use  . Smoking status: Current Every Day Smoker    Packs/day: 1.00    Types: Cigarettes  . Smokeless tobacco: Former Engineer, waterUser  Substance Use Topics  . Alcohol use: Yes    Alcohol/week: 12.0 standard drinks    Types: 12 Cans of beer per week    Comment: 6-12 cans of beer on weekends  . Drug use: No    Review of Systems Constitutional: No fever/chills Eyes: No visual changes. Cardiovascular: Denies chest pain. Respiratory: Denies shortness of breath.  Increased pain with deep inspiration. Gastrointestinal:  No abdominal pain.  No nausea, no vomiting. Musculoskeletal: Positive for left-sided chest wall pain. Skin: Positive for bruising left chest wall. Neurological: Negative for headaches, focal weakness or numbness. ____________________________________________   PHYSICAL EXAM:  VITAL SIGNS: ED Triage Vitals  Enc Vitals Group     BP 04/03/19 1100 140/89     Pulse Rate 04/03/19 1100 (!) 104     Resp 04/03/19 1100 16     Temp 04/03/19 1100 97.7 F (36.5 C)     Temp Source 04/03/19 1100 Oral     SpO2 04/03/19 1100 97 %     Weight --      Height --       Head Circumference --      Peak Flow --      Pain Score 04/03/19 1101 7     Pain Loc --      Pain Edu? --      Excl. in GC? --     Constitutional: Alert and oriented. Well appearing and in no acute distress. Eyes: Conjunctivae are normal.  Head: Atraumatic. Neck: No stridor.   Cardiovascular: Normal rate, regular rhythm. Grossly normal heart sounds.  Good peripheral circulation. Respiratory: Normal respiratory effort.  No retractions. Lungs CTAB.  Moderate tenderness is noted on palpation of the left lateral ribs with an area of ecchymosis that is resolving in the same area. Gastrointestinal: Soft and nontender. No distention. Musculoskeletal: Moves upper and lower extremities without any difficulty and normal gait was noted.  There is no tenderness on palpation of thoracic or lumbar spine. Neurologic:  Normal speech and language. No gross focal neurologic deficits are appreciated. No gait instability. Skin:  Skin is warm, dry and intact.  Ecchymosis as noted above. Psychiatric: Mood and affect are normal. Speech and behavior are normal.  ____________________________________________   LABS (all labs ordered are listed, but only abnormal results are displayed)  Labs Reviewed  COMPREHENSIVE METABOLIC PANEL - Abnormal; Notable for the following components:      Result Value   CO2 21 (*)    Glucose, Bld 100 (*)    All other components within normal limits  FIBRIN DERIVATIVES D-DIMER ([facility] ONLY) - Abnormal; Notable for the following components:   Fibrin derivatives D-dimer [facility]) 657.82 (*)    All other components within normal limits  CBC WITH DIFFERENTIAL/PLATELET  TROPONIN I (HIGH SENSITIVITY)  TROPONIN I (HIGH SENSITIVITY)   RADIOLOGY  Official radiology report(s): Dg Ribs Unilateral W/chest Left  Result Date: 04/03/2019 CLINICAL DATA:  Chest pain after fall. EXAM: LEFT RIBS AND CHEST - 3+ VIEW COMPARISON:  None. FINDINGS: No fracture or other bone lesions are seen  involving the ribs. There is no evidence of pneumothorax or pleural effusion. Both lungs are clear. Heart size and mediastinal contours are within normal limits. IMPRESSION: Negative. Electronically Signed   By: Lupita Raider M.D.   On: 04/03/2019 10:04   Ct Angio Chest Pe W And/or Wo Contrast  Result Date: 04/03/2019 CLINICAL DATA:  Shortness of breath and chest pain after fall last week. EXAM: CT ANGIOGRAPHY CHEST WITH CONTRAST TECHNIQUE: Multidetector CT imaging of the chest was performed using the standard protocol during bolus administration of intravenous contrast. Multiplanar CT image reconstructions and MIPs were obtained to evaluate the vascular anatomy. CONTRAST:  68mL OMNIPAQUE IOHEXOL 350 MG/ML SOLN COMPARISON:  April 10, 2017. FINDINGS: Cardiovascular: Satisfactory opacification of the pulmonary arteries to the segmental level. No evidence of pulmonary embolism. Normal heart size. No pericardial effusion. No evidence  of thoracic aortic dissection or aneurysm. Mediastinum/Nodes: No enlarged mediastinal, hilar, or axillary lymph nodes. Thyroid gland, trachea, and esophagus demonstrate no significant findings. Lungs/Pleura: No pneumothorax or pleural effusion is noted. Minimal emphysematous disease is noted in both upper lobes. No acute consolidative process is noted. Upper Abdomen: No acute abnormality. Musculoskeletal: Nondisplaced fracture is seen involving lateral portion of left seventh rib. Review of the MIP images confirms the above findings. IMPRESSION: No definite evidence of pulmonary embolus. Nondisplaced left seventh rib fracture is noted. Minimal emphysematous disease is noted in the upper lobes bilaterally. Electronically Signed   By: Marijo Conception M.D.   On: 04/03/2019 14:32    ____________________________________________   PROCEDURES  Procedure(s) performed (including Critical Care):  Procedures   ____________________________________________   INITIAL IMPRESSION /  ASSESSMENT AND PLAN / ED COURSE  As part of my medical decision making, I reviewed the following data within the electronic MEDICAL RECORD NUMBER Notes from prior ED visits and [place] Controlled Substance Database  48 year old male presents to the ED after being seen at [facility] care today for further work-up of his left-sided chest wall pain.  Patient has a history of falling on a wet deck approximately 1 week ago and initially was feeling better however for the last 1 to 2 days he is increased pain in this area.  He states that pain is increased with movement and deep inspiration.  Patient has a history of DVTs and urgent care was concerned and wanted further evaluation.  Lab work was within normal limits with exception of D-dimer which was elevated at 657.  A CT with contrast was negative for a PE.  It did show however a nondisplaced fracture of the left seventh rib.  Patient was given Toradol while in the ED because he was driving alone.  Patient states that there was some improvement of his pain with the Toradol.  After his rib fracture was discussed a prescription for Percocet was sent to his pharmacy.  He is aware that it will take 4 to 6 weeks for this to resolve completely.  He is encouraged to take deep breaths and decrease smoking.  Patient reports that he does have a incentive spirometer at home that he will begin using.  He is to return to the emergency department if any severe worsening of his symptoms.  ____________________________________________   FINAL CLINICAL IMPRESSION(S) / ED DIAGNOSES  Final diagnoses:  Closed fracture of one rib of left side, initial encounter     ED Discharge Orders         Ordered    oxyCODONE-acetaminophen (PERCOCET) 5-325 MG tablet  Every 6 hours PRN     04/03/19 1452           Note:  This document was prepared using Dragon voice recognition software and may include unintentional dictation errors.    Johnn Hai, PA-C 04/03/19 1650     Blake Divine, MD 04/04/19 1323

## 2019-04-03 NOTE — ED Triage Notes (Signed)
Patient states that he fell last Monday and fell backwards coming down the steps. Patient states that pain had subsided but is now have sharp chest pain on the side of chest that he describes as a spear shooting through him.

## 2019-04-03 NOTE — Discharge Instructions (Addendum)
Go directly to emergency room.  

## 2019-04-03 NOTE — Discharge Instructions (Addendum)
Follow-up with your primary care provider if any continued problems or additional pain medication as needed.  Use your incentive spirometer daily as you did when you had surgery.  This and reduction of your smoking should help keep you from getting pneumonia.  Take pain medication only as directed.  Do not drive or operate machinery while taking the pain medication.  Rib pain can last up to 4-6 weeks.  After the initial pain medication you may get relief with Tylenol or ibuprofen.

## 2021-07-04 IMAGING — CR DG RIBS W/ CHEST 3+V*L*
5 series · 5 of 5 positions shown · non-contrast
Comparison: None.

CLINICAL DATA: Chest pain after fall.

EXAM:
LEFT RIBS AND CHEST - 3+ VIEW

[chest pa]
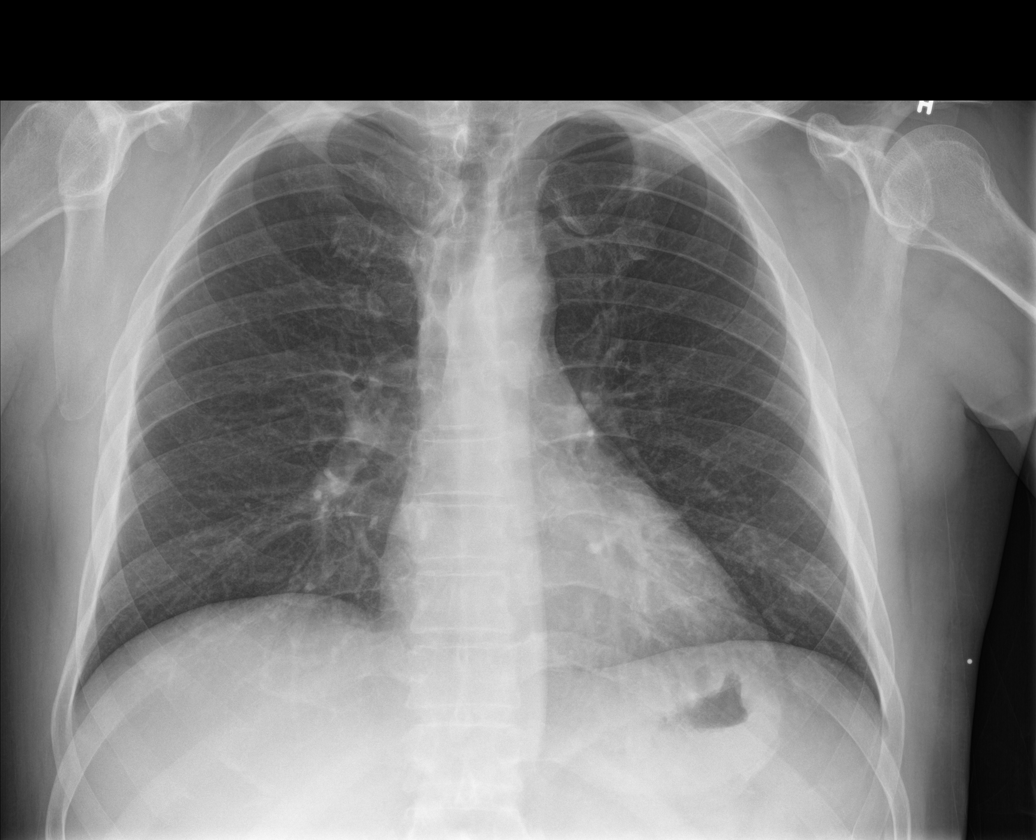

[rib pa (1 of 2)]
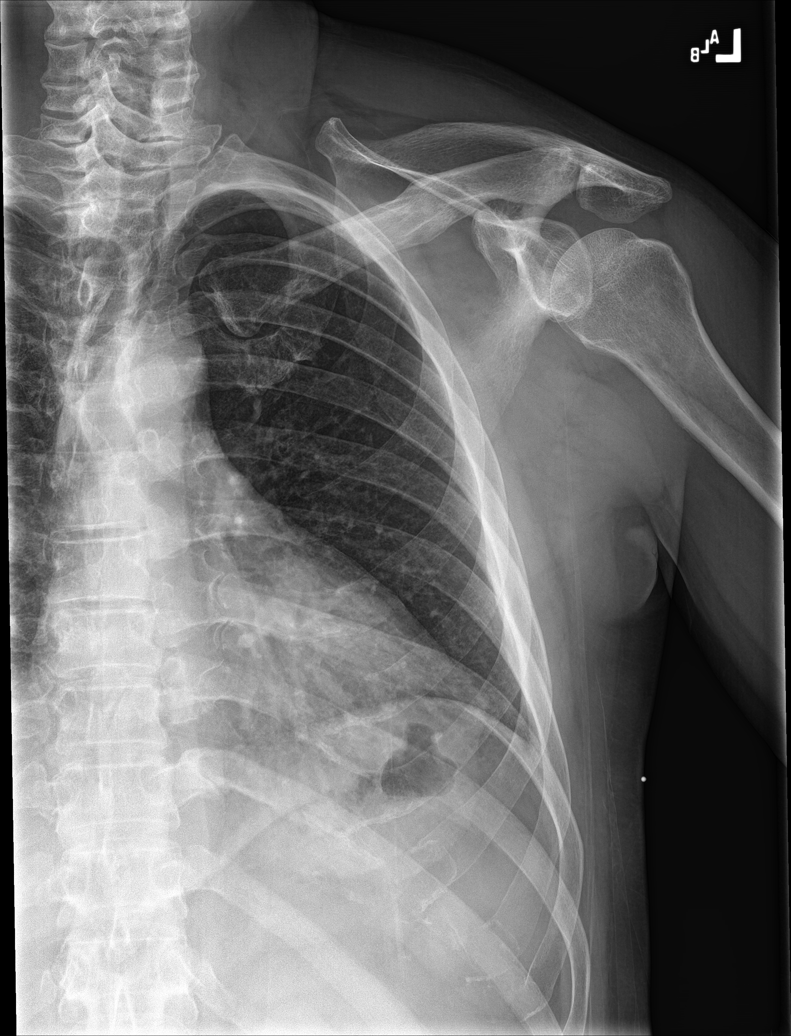

[rib pa (2 of 2)]
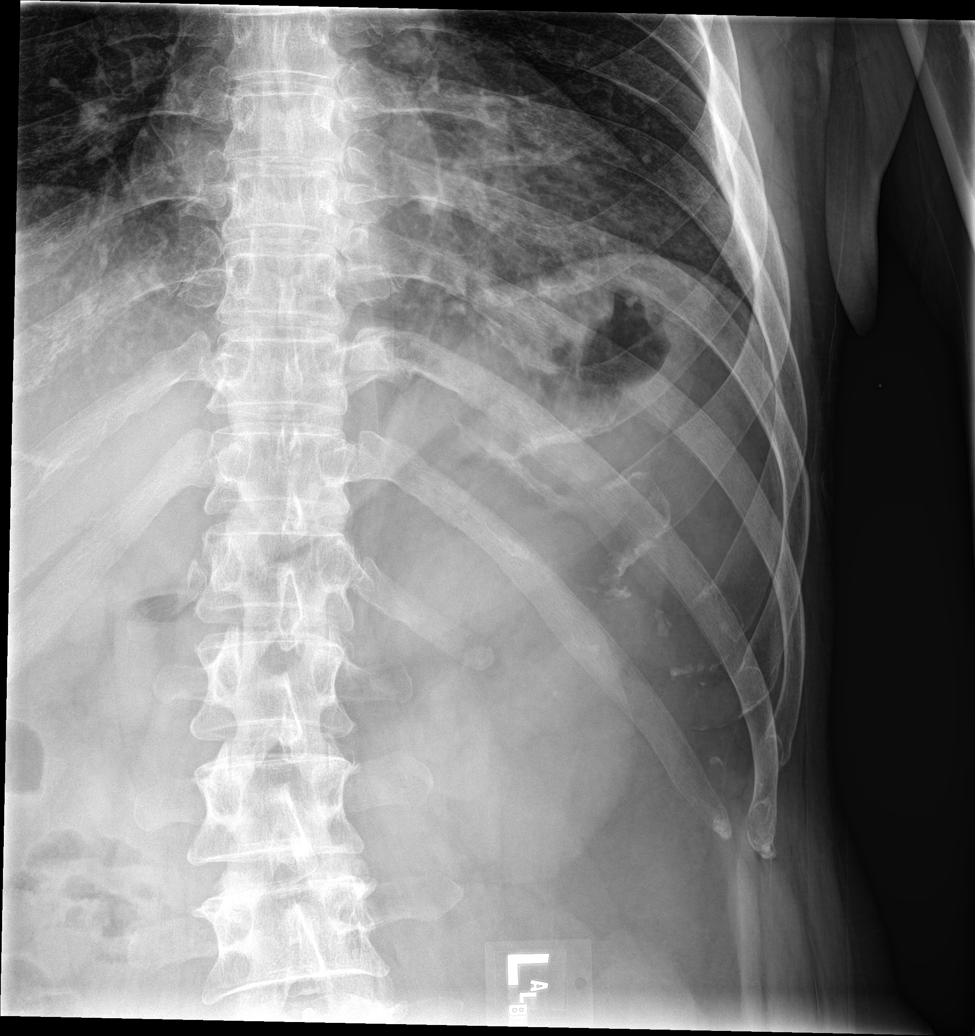

[rib obl (1 of 2)]
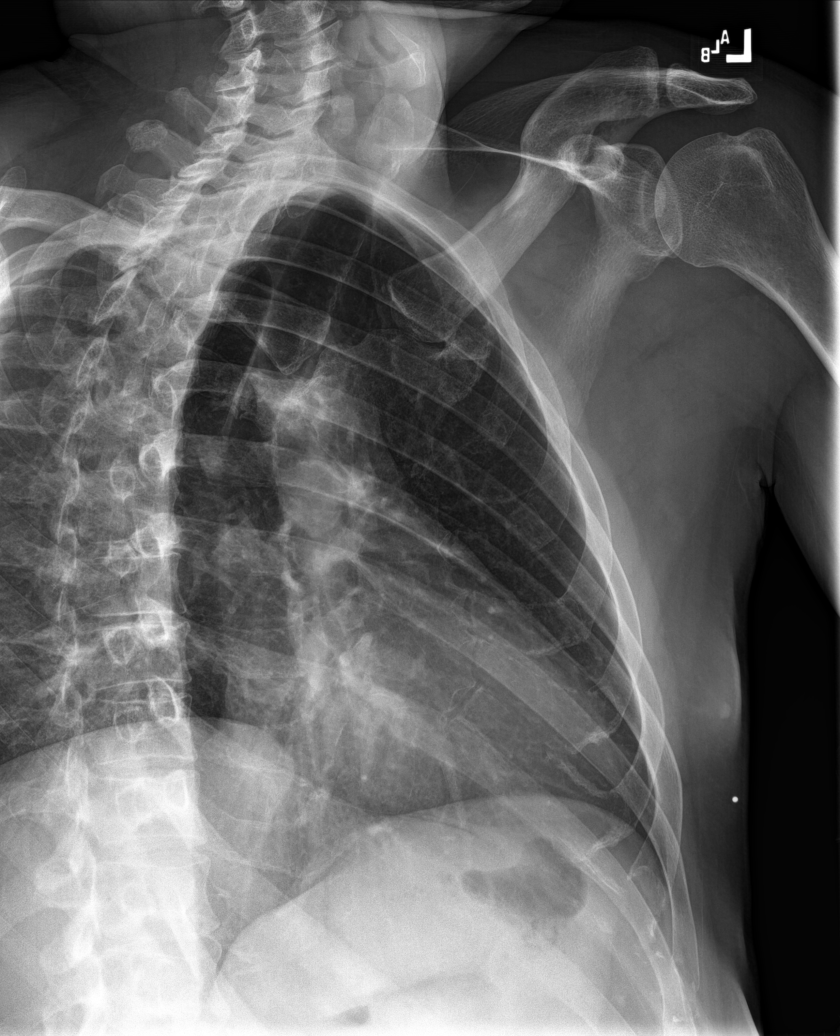

[rib obl (2 of 2)]
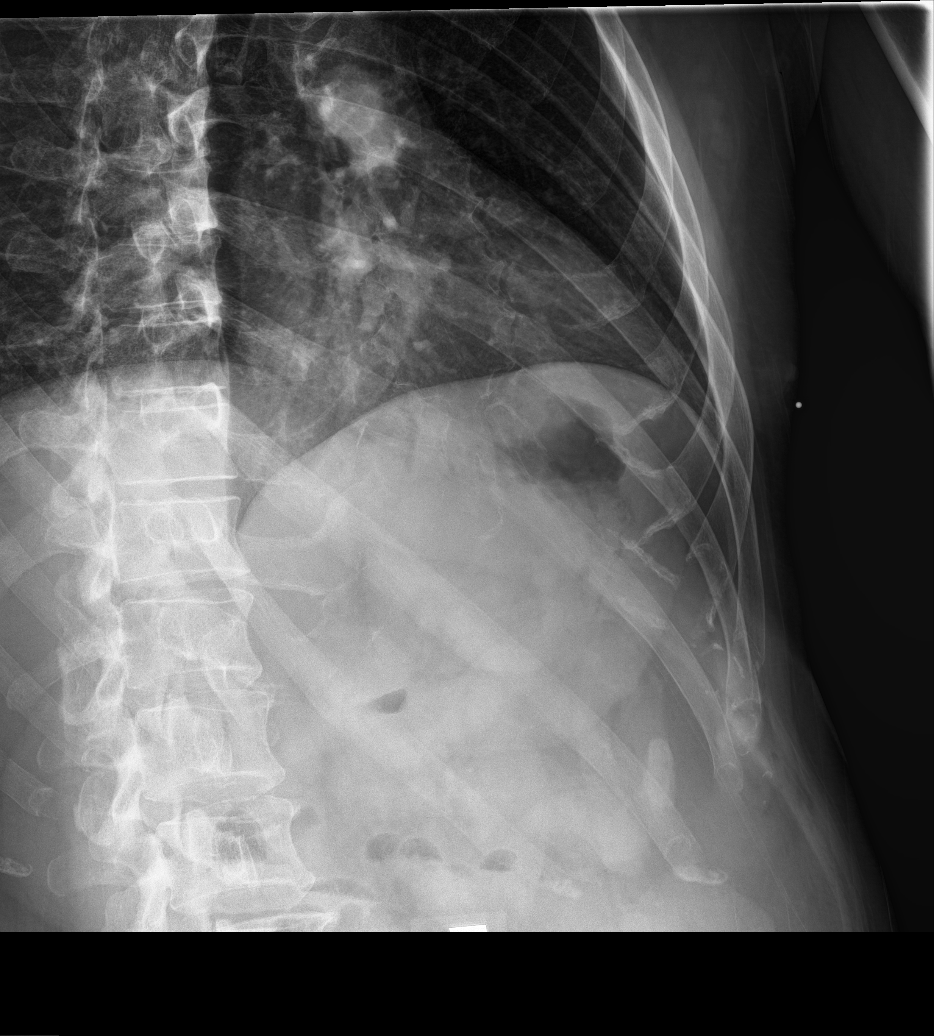

[5 of 5 positions shown; findings below may reference images not displayed]

FINDINGS: No fracture or other bone lesions are seen involving the ribs. There
is no evidence of pneumothorax or pleural effusion. Both lungs are
clear. Heart size and mediastinal contours are within normal limits.
IMPRESSION: Negative.

## 2021-07-04 IMAGING — CT CT ANGIO CHEST
2 of 6 series · 18 of 46 positions shown · IV contrast (APPLIED)
Comparison: April 10, 2017.

CLINICAL DATA: Shortness of breath and chest pain after fall last
week.

EXAM:
CT ANGIOGRAPHY CHEST WITH CONTRAST
TECHNIQUE: Multidetector CT imaging of the chest was performed using the
standard protocol during bolus administration of intravenous
contrast. Multiplanar CT image reconstructions and MIPs were
obtained to evaluate the vascular anatomy.
CONTRAST:  75mL OMNIPAQUE IOHEXOL 350 MG/ML SOLN

[Series 5: thins · axial · 0.72mm/px · z∈[-456,-153]mm · 15 of 333 slices shown]
[im 15/333  lung]
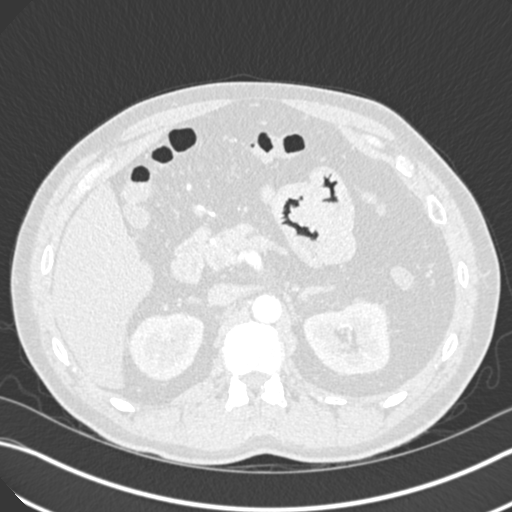
[im 44/333  soft-tissue]
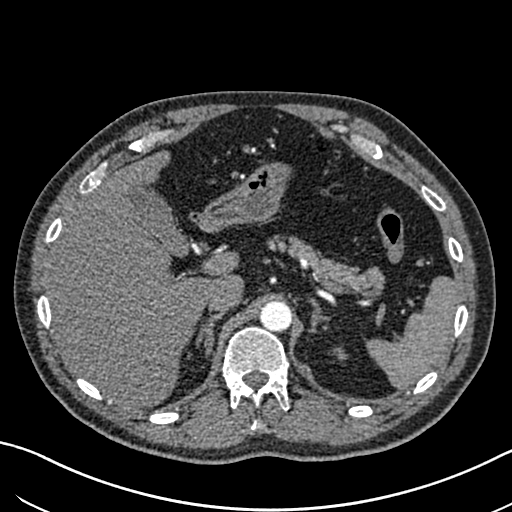
[im 58/333  lung]
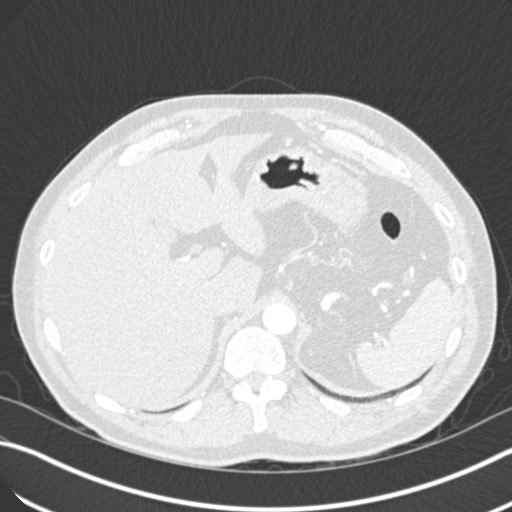
[im 87/333  soft-tissue]
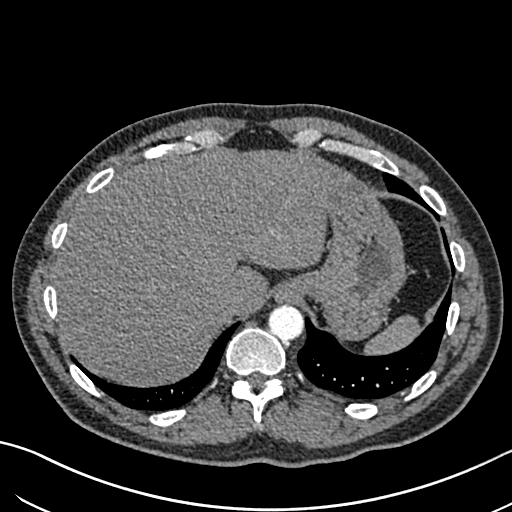
[im 102/333  lung]
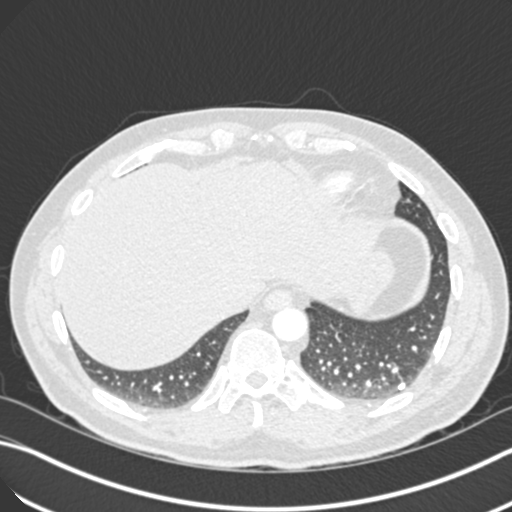
[im 130/333  soft-tissue]
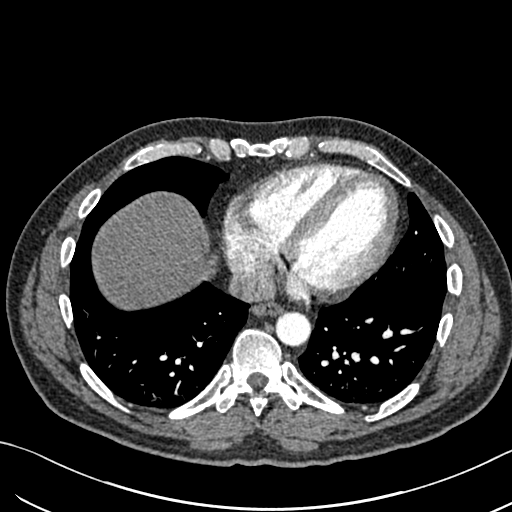
[im 145/333  lung]
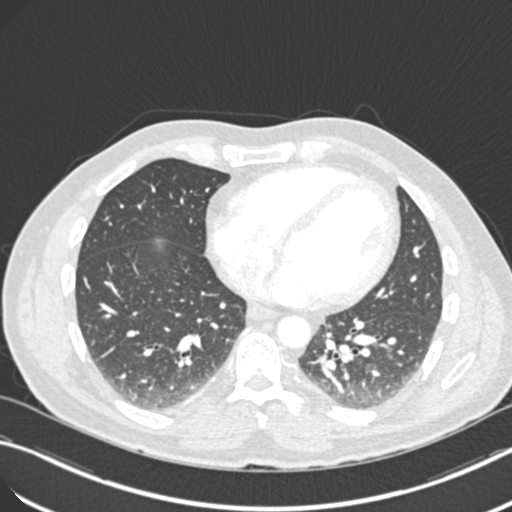
[im 174/333  soft-tissue]
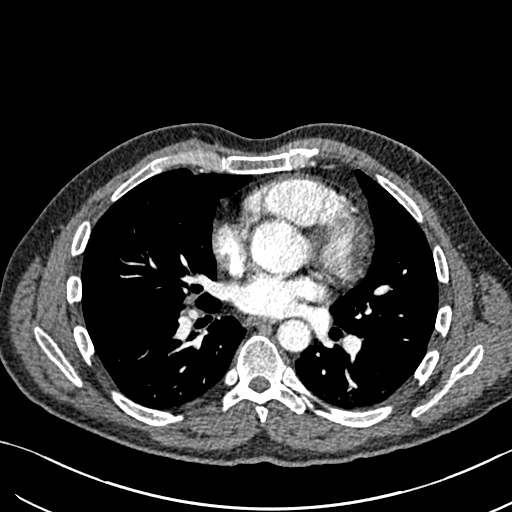
[im 188/333  lung]
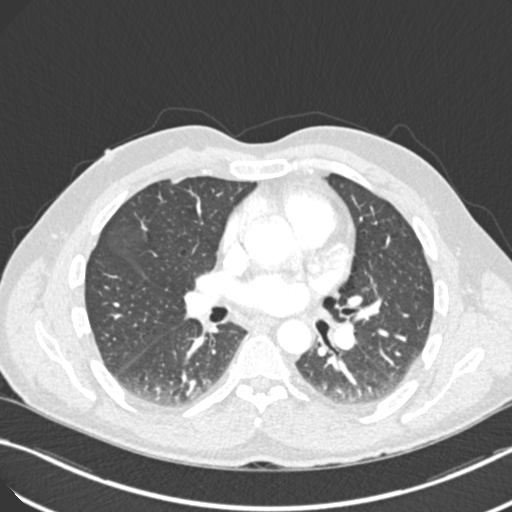
[im 203/333  soft-tissue]
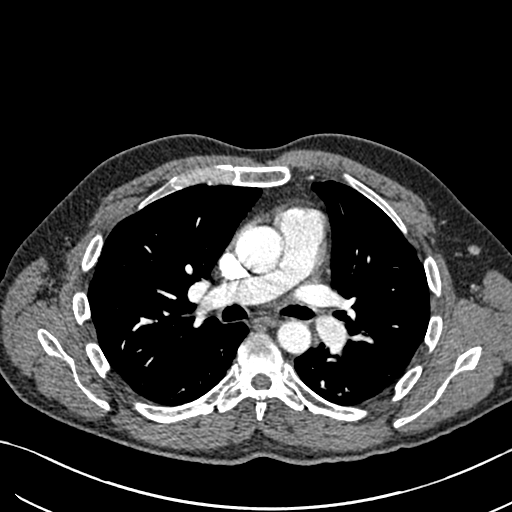
[im 231/333  lung]
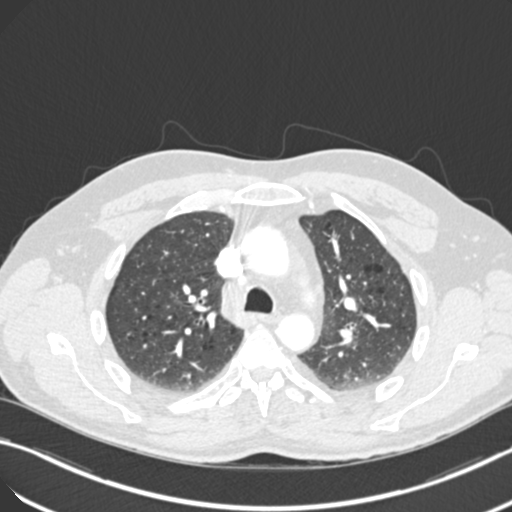
[im 246/333  soft-tissue]
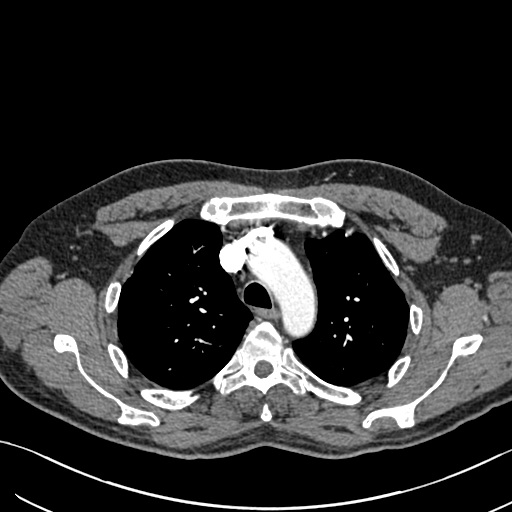
[im 275/333  lung]
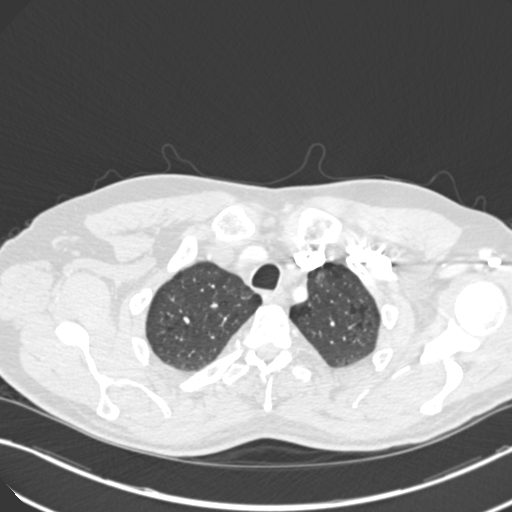
[im 289/333  soft-tissue]
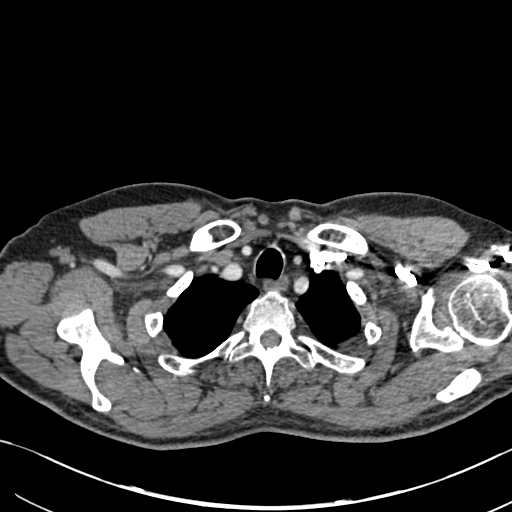
[im 318/333  lung]
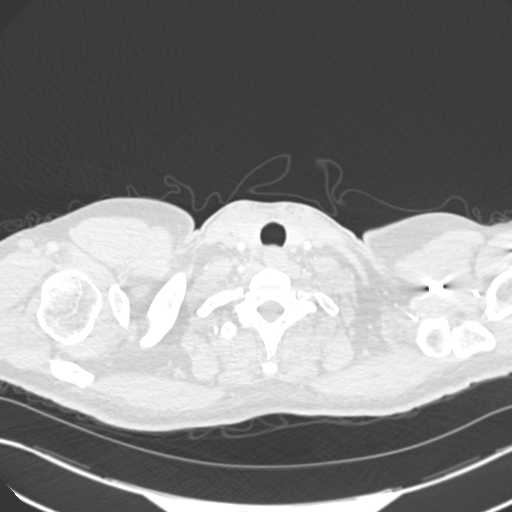

[Series 7: coronal mpr · coronal · 0.65mm/px · 3 of 90 slices shown]
[im 23/90  soft-tissue]
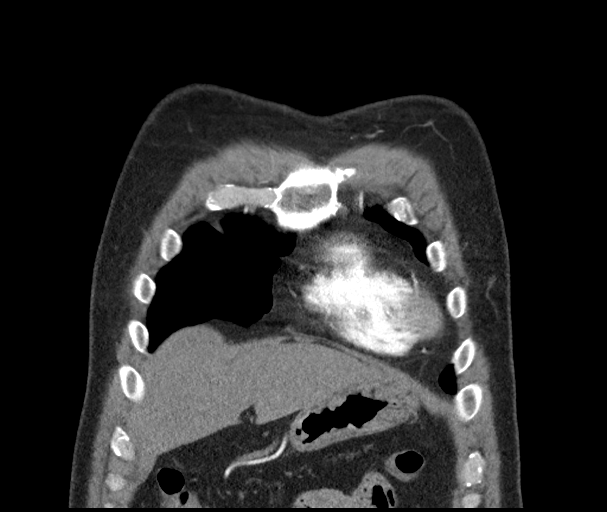
[im 45/90  soft-tissue]
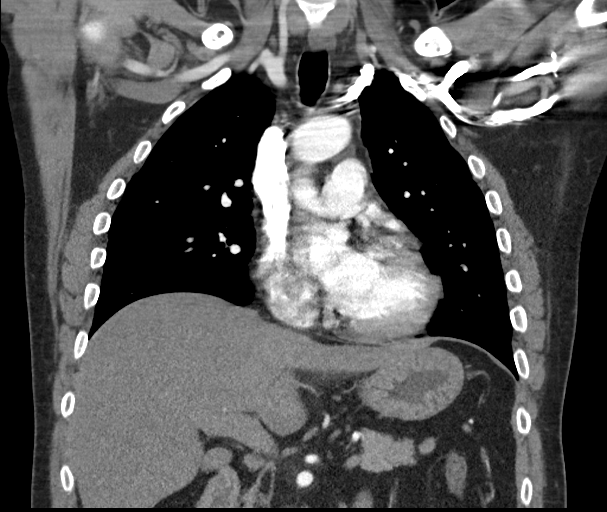
[im 67/90  soft-tissue]
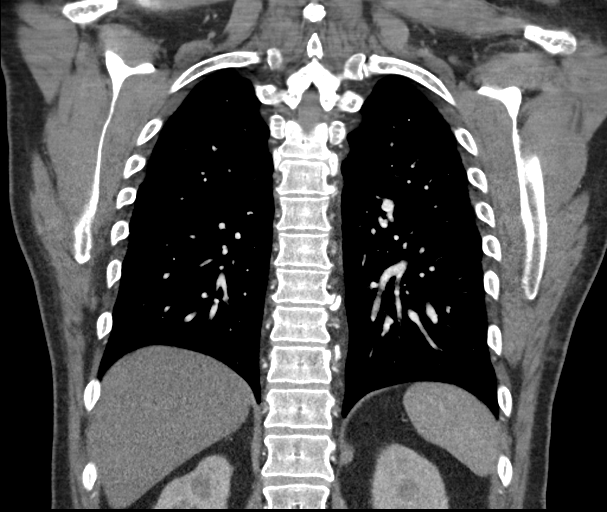

[18 of 46 positions shown; findings below may reference images not displayed]

FINDINGS: Cardiovascular: Satisfactory opacification of the pulmonary arteries
to the segmental level. No evidence of pulmonary embolism. Normal
heart size. No pericardial effusion. No evidence of thoracic aortic
dissection or aneurysm.

Mediastinum/Nodes: No enlarged mediastinal, hilar, or axillary lymph
nodes. Thyroid gland, trachea, and esophagus demonstrate no
significant findings.

Lungs/Pleura: No pneumothorax or pleural effusion is noted. Minimal
emphysematous disease is noted in both upper lobes. No acute
consolidative process is noted.

Upper Abdomen: No acute abnormality.

Musculoskeletal: Nondisplaced fracture is seen involving lateral
portion of left seventh rib.

Review of the MIP images confirms the above findings.
IMPRESSION: No definite evidence of pulmonary embolus.

Nondisplaced left seventh rib fracture is noted.

Minimal emphysematous disease is noted in the upper lobes
bilaterally.

## 2022-10-29 ENCOUNTER — Encounter: Payer: Self-pay | Admitting: Emergency Medicine

## 2022-10-29 ENCOUNTER — Ambulatory Visit
Admission: EM | Admit: 2022-10-29 | Discharge: 2022-10-29 | Disposition: A | Discharge status: Home / Self Care | Payer: No Typology Code available for payment source

## 2022-10-29 DIAGNOSIS — S61211A Laceration without foreign body of left index finger without damage to nail, initial encounter: Secondary | ICD-10-CM

## 2022-10-29 NOTE — Discharge Instructions (Signed)
-  5 sutures placed. Dont get the area wet for 2 days then clean daily gently with soap and water and cover with bandaid. -Return in 1 week for suture removal or soon if signs of infection.

## 2022-10-29 NOTE — ED Provider Notes (Signed)
[facility]    CSN: [id number] Arrival date & time: 10/29/22  1645      History   Chief Complaint Chief Complaint  Patient presents with   Extremity Laceration    HPI Sean Chen is a 52 y.o. left hand dominant male presenting for laceration of the left distal index finger that occurred at 4 PM today. 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  He has had it wrapped up for the past 2 hours but says it continues to bleed.  He thinks the bleeding has slowed down.  He reports that the tip of the finger feels a little numb.  He has full range of motion of the finger.  He has cleaned it.  He says his tetanus is up-to-date.  No other injuries.  No other complaints.  HPI  Past Medical History:  Diagnosis Date   Arthritis    right foot   Dvt femoral (deep venous thrombosis) (HCC) 03/2016   left leg   Peripheral vascular disease [facility])     Patient Active Problem List   Diagnosis Date Noted   DJD (degenerative joint disease) of knee 08/02/2016   Acute deep vein thrombosis (DVT) of popliteal vein of left lower extremity (HCC) 04/07/2016   Pain and swelling of lower extremity 04/07/2016   Tobacco dependence 04/07/2016    Past Surgical History:  Procedure Laterality Date   APPENDECTOMY     CYST EXCISION  2000   buttocks   FOOT ARTHRODESIS Right 11/12/2016   Procedure: ARTHRODESIS FOOT-SUBTALOR JOINT FUSION;  Surgeon: Gwyneth Revels, DPM;  Location: [facility];  Service: Podiatry;  Laterality: Right;   KNEE ARTHROSCOPY WITH LATERAL RELEASE Left 05/18/2017   Procedure: KNEE ARTHROSCOPY WITH DEBRIDEMENT and LATERAL RELEASE;  Surgeon: Christena Flake, MD;  Location: [facility];  Service: Orthopedics;  Laterality: Left;   ORIF ANKLE FRACTURE Right 01/01/2015   Procedure: OPEN REDUCTION INTERNAL FIXATION (ORIF) CALCANEUS;  Surgeon: Gwyneth Revels, DPM;  Location: [facility];  Service: Podiatry;  Laterality: Right;   POPLITEAL   WISDOM TOOTH EXTRACTION  1999   4 teeth       Home Medications    Prior to Admission medications   Medication Sig Start Date End Date Taking? Authorizing Provider  naltrexone (DEPADE) 50 MG tablet TAKE ONE TABLET BY MOUTH EVERY DAY ALCOHOL USE DISORDER 09/24/22  Yes [provider]  prazosin (MINIPRESS) 1 MG capsule TAKE ONE CAPSULE BY MOUTH AT BEDTIME INSOMNIA AND NIGHTMARES 09/24/22  Yes [provider]  oxyCODONE-acetaminophen (PERCOCET) 5-325 MG tablet Take 1 tablet by mouth every 6 (six) hours as needed for severe pain. 04/03/19   Tommi Rumps, PA-C    Family History Family History  Family history unknown: Yes    Social History Social History   Tobacco Use   Smoking status: Every Day    Packs/day: 1    Types: Cigarettes   Smokeless tobacco: Former  Building services engineer Use: Never used  Substance Use Topics   Alcohol use: Yes    Alcohol/week: 12.0 standard drinks of alcohol    Types: 12 Cans of beer per week    Comment: 6-12 cans of beer on weekends   Drug use: No     Allergies   Patient has no known allergies.   Review of Systems Review of Systems  Skin:  Positive for wound. Negative for color change.  Neurological:  Positive  for numbness. Negative for weakness.     Physical Exam Triage Vital Signs ED Triage Vitals  Enc Vitals Group     BP 10/29/22 1718 131/87     Pulse Rate 10/29/22 1718 94     Resp 10/29/22 1718 15     Temp 10/29/22 1718 98.6 F (37 C)     Temp Source 10/29/22 1718 Oral     SpO2 10/29/22 1718 100 %     Weight 10/29/22 1717 179 lb 14.3 oz (81.6 kg)     Height 10/29/22 1717 5\' 9"  (1.753 m)     Head Circumference --      Peak Flow --      Pain Score 10/29/22 1717 2     Pain Loc --      Pain Edu? --      Excl. in GC? --    No data found.  Updated Vital Signs BP 131/87 (BP Location: Right Arm)   Pulse 94   Temp 98.6 F (37 C) (Oral)   Resp 15   Ht 5\' 9"  (1.753 m)   Wt 179 lb 14.3 oz  (81.6 kg)   SpO2 100%   BMI 26.57 kg/m      Physical Exam Vitals and nursing note reviewed.  Constitutional:      General: He is not in acute distress.    Appearance: Normal appearance. He is well-developed. He is not ill-appearing.  HENT:     Head: Normocephalic and atraumatic.  Eyes:     Conjunctiva/sclera: Conjunctivae normal.  Cardiovascular:     Rate and Rhythm: Normal rate.     Pulses: Normal pulses.  Pulmonary:     Effort: Pulmonary effort is normal. No respiratory distress.  Musculoskeletal:     Cervical back: Neck supple.  Skin:    General: Skin is warm and dry.     Capillary Refill: Capillary refill takes less than 2 seconds.     Findings: Wound (1.5 cm laceration distal left index finger with moderate bleeding, exposed fatty tissue. Full ROM. Normal cap refill.) present.  Neurological:     General: No focal deficit present.     Mental Status: He is alert. Mental status is at baseline.     Motor: No weakness.     Gait: Gait normal.  Psychiatric:        Mood and Affect: Mood normal.      UC Treatments / Results  Labs (all labs ordered are listed, but only abnormal results are displayed) Labs Reviewed - No data to display  EKG   Radiology No results found.  Procedures Laceration Repair  Date/Time: 10/29/2022 7:31 PM  Performed by: Shirlee Latch, PA-C Authorized by: Shirlee Latch, PA-C   Consent:    Consent obtained:  Verbal   Consent given by:  Patient   Risks discussed:  Infection, pain, retained foreign body, poor cosmetic result and poor wound healing Anesthesia:    Anesthesia method:  Local infiltration   Local anesthetic:  Lidocaine 1% w/o epi Laceration details:    Location:  Finger   Finger location:  L index finger   Length (cm):  1.5 Pre-procedure details:    Preparation:  Patient was prepped and draped in usual sterile fashion Exploration:    Hemostasis achieved with:  Direct pressure and tourniquet   Imaging outcome: foreign  body not noted     Wound exploration: wound explored through full range of motion     Contaminated: no   Treatment:  Area cleansed with:  Saline   Amount of cleaning:  Extensive   Irrigation solution:  Sterile saline   Visualized foreign bodies/material removed: no     Debridement:  None Skin repair:    Repair method:  Sutures   Suture size:  4-0   Suture technique:  Simple interrupted   Number of sutures:  5 Approximation:    Approximation:  Close Repair type:    Repair type:  Simple Post-procedure details:    Dressing:  Sterile dressing   Procedure completion:  Tolerated well, no immediate complications  (including critical care time)  Medications Ordered in UC Medications - No data to display  Initial Impression / Assessment and Plan / UC Course  I have reviewed the triage vital signs and the nursing notes.  Pertinent labs & imaging results that were available during my care of the patient were reviewed by me and considered in my medical decision making (see chart for details).   52 year old left-hand-dominant male presents for laceration of the left index finger that occurred about 2 hours ago while at work.  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  He has cleaned it.  He is up-to-date with tetanus.  He agrees to Systems analyst.  Wound thoroughly flushed and cleansed with saline.  Applied 4-0 sutures, simple interrupted x 5.  He tolerated this well.  I did need to use a tourniquet to help control the bleeding initially.  I thoroughly reviewed wound/laceration care.  Advised him to follow-up in a week to have the sutures removed or sooner for any signs of infection.  Encouraged use of Tylenol and or Motrin for discomfort.   Final Clinical Impressions(s) / UC Diagnoses   Final diagnoses:  Laceration of left index finger without foreign body without damage to nail, initial encounter     Discharge Instructions      -5 sutures placed. Dont get the area wet for 2 days then  clean daily gently with soap and water and cover with bandaid. -Return in 1 week for suture removal or soon if signs of infection.     ED Prescriptions   None    PDMP not reviewed this encounter.   Shirlee Latch, PA-C 10/29/22 1934

## 2022-10-29 NOTE — ED Triage Notes (Signed)
Patient states that he cut his left 2nd finger around 4 pm today.  Patient states that he cut it with a knife while at work.  Patient has pressure dressing to the site.
